# Patient Record
Sex: Female | Born: 1985 | Race: White | Hispanic: No | Marital: Single | Smoking: Former smoker
Health system: Southern US, Community
[De-identification: ages and names within clinical notes are randomized; demographics above are authoritative.]

## PROBLEM LIST (undated history)

## (undated) DIAGNOSIS — Z87440 Personal history of urinary (tract) infections: Secondary | ICD-10-CM

## (undated) DIAGNOSIS — F4310 Post-traumatic stress disorder, unspecified: Secondary | ICD-10-CM

## (undated) DIAGNOSIS — G43909 Migraine, unspecified, not intractable, without status migrainosus: Secondary | ICD-10-CM

## (undated) DIAGNOSIS — K3184 Gastroparesis: Secondary | ICD-10-CM

## (undated) DIAGNOSIS — N83209 Unspecified ovarian cyst, unspecified side: Secondary | ICD-10-CM

## (undated) DIAGNOSIS — K922 Gastrointestinal hemorrhage, unspecified: Secondary | ICD-10-CM

## (undated) DIAGNOSIS — N200 Calculus of kidney: Secondary | ICD-10-CM

## (undated) DIAGNOSIS — G8929 Other chronic pain: Secondary | ICD-10-CM

## (undated) DIAGNOSIS — F419 Anxiety disorder, unspecified: Secondary | ICD-10-CM

## (undated) DIAGNOSIS — M549 Dorsalgia, unspecified: Secondary | ICD-10-CM

## (undated) DIAGNOSIS — C9590 Leukemia, unspecified not having achieved remission: Secondary | ICD-10-CM

## (undated) HISTORY — PX: TUBAL LIGATION: SHX77

## (undated) HISTORY — PX: FRACTURE SURGERY: SHX138

## (undated) HISTORY — PX: JOINT REPLACEMENT: SHX530

## (undated) HISTORY — PX: APPENDECTOMY: SHX54

---

## 2012-12-19 DIAGNOSIS — N390 Urinary tract infection, site not specified: Secondary | ICD-10-CM | POA: Insufficient documentation

## 2012-12-19 DIAGNOSIS — Z3202 Encounter for pregnancy test, result negative: Secondary | ICD-10-CM | POA: Insufficient documentation

## 2012-12-19 DIAGNOSIS — F411 Generalized anxiety disorder: Secondary | ICD-10-CM | POA: Insufficient documentation

## 2012-12-19 DIAGNOSIS — R1110 Vomiting, unspecified: Secondary | ICD-10-CM | POA: Insufficient documentation

## 2012-12-19 DIAGNOSIS — Z87442 Personal history of urinary calculi: Secondary | ICD-10-CM | POA: Insufficient documentation

## 2012-12-19 DIAGNOSIS — M549 Dorsalgia, unspecified: Secondary | ICD-10-CM | POA: Insufficient documentation

## 2012-12-19 DIAGNOSIS — Z8659 Personal history of other mental and behavioral disorders: Secondary | ICD-10-CM | POA: Insufficient documentation

## 2012-12-19 DIAGNOSIS — G8929 Other chronic pain: Secondary | ICD-10-CM | POA: Insufficient documentation

## 2012-12-19 DIAGNOSIS — Z79899 Other long term (current) drug therapy: Secondary | ICD-10-CM | POA: Insufficient documentation

## 2012-12-19 DIAGNOSIS — G43909 Migraine, unspecified, not intractable, without status migrainosus: Secondary | ICD-10-CM | POA: Insufficient documentation

## 2012-12-19 DIAGNOSIS — R197 Diarrhea, unspecified: Secondary | ICD-10-CM | POA: Insufficient documentation

## 2012-12-19 DIAGNOSIS — Z8742 Personal history of other diseases of the female genital tract: Secondary | ICD-10-CM | POA: Insufficient documentation

## 2012-12-20 ENCOUNTER — Encounter (HOSPITAL_BASED_OUTPATIENT_CLINIC_OR_DEPARTMENT_OTHER): Payer: Self-pay | Admitting: Emergency Medicine

## 2012-12-20 ENCOUNTER — Emergency Department (HOSPITAL_BASED_OUTPATIENT_CLINIC_OR_DEPARTMENT_OTHER)
Admission: EM | Admit: 2012-12-20 | Discharge: 2012-12-20 | Disposition: A | Discharge status: Home / Self Care | Payer: Medicaid Other | Attending: Emergency Medicine | Admitting: Emergency Medicine

## 2012-12-20 ENCOUNTER — Emergency Department (HOSPITAL_BASED_OUTPATIENT_CLINIC_OR_DEPARTMENT_OTHER): Payer: Medicaid Other

## 2012-12-20 DIAGNOSIS — N390 Urinary tract infection, site not specified: Secondary | ICD-10-CM

## 2012-12-20 HISTORY — DX: Dorsalgia, unspecified: M54.9

## 2012-12-20 HISTORY — DX: Other chronic pain: G89.29

## 2012-12-20 HISTORY — DX: Calculus of kidney: N20.0

## 2012-12-20 HISTORY — DX: Post-traumatic stress disorder, unspecified: F43.10

## 2012-12-20 HISTORY — DX: Leukemia, unspecified not having achieved remission: C95.90

## 2012-12-20 HISTORY — DX: Anxiety disorder, unspecified: F41.9

## 2012-12-20 HISTORY — DX: Migraine, unspecified, not intractable, without status migrainosus: G43.909

## 2012-12-20 HISTORY — DX: Unspecified ovarian cyst, unspecified side: N83.209

## 2012-12-20 LAB — CBC WITH DIFFERENTIAL/PLATELET
Basophils Absolute: 0 10*3/uL (ref 0.0–0.1)
Eosinophils Absolute: 0.4 10*3/uL (ref 0.0–0.7)
Eosinophils Relative: 3 % (ref 0–5)
Hemoglobin: 13.7 g/dL (ref 12.0–15.0)
Lymphs Abs: 4 10*3/uL (ref 0.7–4.0)
MCH: 29.4 pg (ref 26.0–34.0)
MCHC: 35 g/dL (ref 30.0–36.0)
MCV: 83.9 fL (ref 78.0–100.0)
Monocytes Relative: 5 % (ref 3–12)
Platelets: 306 10*3/uL (ref 150–400)
RBC: 4.66 MIL/uL (ref 3.87–5.11)

## 2012-12-20 LAB — URINALYSIS, ROUTINE W REFLEX MICROSCOPIC
Bilirubin Urine: NEGATIVE
Hgb urine dipstick: NEGATIVE
Ketones, ur: NEGATIVE mg/dL
Protein, ur: NEGATIVE mg/dL
Urobilinogen, UA: 1 mg/dL (ref 0.0–1.0)

## 2012-12-20 LAB — GC/CHLAMYDIA PROBE AMP: GC Probe RNA: NEGATIVE

## 2012-12-20 LAB — BASIC METABOLIC PANEL
BUN: 10 mg/dL (ref 6–23)
Calcium: 9.8 mg/dL (ref 8.4–10.5)
GFR calc non Af Amer: >90 mL/min (ref >90)
Glucose, Bld: 98 mg/dL (ref 70–99)
Sodium: 143 mEq/L (ref 135–145)

## 2012-12-20 LAB — URINE MICROSCOPIC-ADD ON

## 2012-12-20 LAB — WET PREP, GENITAL: Trich, Wet Prep: NONE SEEN

## 2012-12-20 MED ORDER — HYDROMORPHONE HCL PF 1 MG/ML IJ SOLN
1.0000 mg | Freq: Once | INTRAMUSCULAR | Status: AC
  Administered 2012-12-20: 1 mg via INTRAVENOUS
  Filled 2012-12-20: qty 1

## 2012-12-20 MED ORDER — ONDANSETRON HCL 4 MG/2ML IJ SOLN
4.0000 mg | Freq: Once | INTRAMUSCULAR | Status: AC
  Administered 2012-12-20: 4 mg via INTRAVENOUS
  Filled 2012-12-20: qty 2

## 2012-12-20 MED ORDER — ONDANSETRON 8 MG PO TBDP: ORAL_TABLET | ORAL | Status: DC

## 2012-12-20 MED ORDER — PHENAZOPYRIDINE HCL 200 MG PO TABS: 200.0000 mg | ORAL_TABLET | Freq: Three times a day (TID) | ORAL | Status: DC

## 2012-12-20 MED ORDER — NITROFURANTOIN MONOHYD MACRO 100 MG PO CAPS: 100.0000 mg | ORAL_CAPSULE | Freq: Two times a day (BID) | ORAL | Status: DC

## 2012-12-20 NOTE — ED Notes (Signed)
MD at bedside. 

## 2012-12-20 NOTE — ED Notes (Signed)
Pt c/o lower abd pain with n/v/d x 3 days 

## 2012-12-20 NOTE — ED Provider Notes (Signed)
CSN: [id number]     Arrival date & time 12/19/12  2356 History   First MD Initiated Contact with Patient 12/20/12 0024     Chief Complaint  Patient presents with  . Abdominal Pain   (Consider location/radiation/quality/duration/timing/severity/associated sxs/prior Treatment) Patient is a 27 y.o. female presenting with abdominal pain. The history is provided by the patient.  Abdominal Pain Pain location:  R flank and suprapubic Pain quality: aching   Pain severity:  Severe Onset quality:  Gradual Timing:  Constant Progression:  Unchanged Context: eating   Context: not previous surgeries and not trauma   Relieved by:  Nothing Worsened by:  Nothing tried Ineffective treatments: oxycodone. Associated symptoms: diarrhea and vomiting   Associated symptoms: no dysuria, no fever and no vaginal bleeding   Diarrhea:    Quality:  Watery   Severity:  Moderate   Timing:  Intermittent   Progression:  Unchanged Vomiting:    Quality:  Stomach contents   Severity:  Moderate   Timing:  Intermittent   Progression:  Unchanged Risk factors: not pregnant     Past Medical History  Diagnosis Date  . Leukemia   . Migraine   . Kidney stones   . Ovarian cyst   . PTSD (post-traumatic stress disorder)   . Anxiety   . Chronic back pain    Past Surgical History  Procedure Laterality Date  . Appendectomy    . Tubal ligation    . Joint replacement     History reviewed. No pertinent family history. History  Substance Use Topics  . Smoking status: Never Smoker   . Smokeless tobacco: Not on file  . Alcohol Use: No   OB History   Grav Para Term Preterm Abortions TAB SAB Ect Mult Living                 Review of Systems  Constitutional: Negative for fever.  Gastrointestinal: Positive for vomiting, abdominal pain and diarrhea.  Genitourinary: Negative for dysuria and vaginal bleeding.  All other systems reviewed and are negative.    Allergies  Compazine; Morphine and related;  Norco; Penicillins; Rocephin; Sulfa antibiotics; and Toradol  Home Medications   Current Outpatient Rx  Name  Route  Sig  Dispense  Refill  . ALPRAZolam (XANAX) 1 MG tablet   Oral   Take 1 mg by mouth at bedtime as needed for anxiety.         Marland Kitchen desipramine (NORPRAMIN) 50 MG tablet   Oral   Take 50 mg by mouth daily.         Marland Kitchen dicyclomine (BENTYL) 10 MG capsule   Oral   Take 10 mg by mouth 4 (four) times daily -  before meals and at bedtime.         . gabapentin (NEURONTIN) 100 MG capsule   Oral   Take 100 mg by mouth 3 (three) times daily.         . metoCLOPramide (REGLAN) 10 MG tablet   Oral   Take 10 mg by mouth 4 (four) times daily.         . ondansetron (ZOFRAN) 4 MG tablet   Oral   Take 4 mg by mouth every 8 (eight) hours as needed for nausea or vomiting.         Marland Kitchen oxyCODONE-acetaminophen (ROXICET) 5-325 MG/5ML solution   Oral   Take by mouth every 4 (four) hours as needed for severe pain.         . phenylephrine-chlorpheniramine-dihydrocodeine (  PANCOF-PD) 7.5-2-3 MG/5ML syrup   Oral   Take 5 mLs by mouth every 6 (six) hours as needed for cough.         . promethazine (PHENERGAN) 25 MG tablet   Oral   Take 25 mg by mouth every 6 (six) hours as needed for nausea or vomiting.          BP 125/86  Pulse 82  Temp(Src) 98.2 F (36.8 C) (Oral)  Resp 16  Ht 5\' 6"  (1.676 m)  Wt 146 lb (66.225 kg)  BMI 23.58 kg/m2  SpO2 99%  LMP 12/13/2012 Physical Exam  Constitutional: She is oriented to person, place, and time. She appears well-developed and well-nourished. No distress.  HENT:  Head: Normocephalic and atraumatic.  Mouth/Throat: Oropharynx is clear and moist. No oropharyngeal exudate.  Eyes: Conjunctivae are normal. Pupils are equal, round, and reactive to light.  Neck: Normal range of motion. Neck supple.  Cardiovascular: Normal rate, regular rhythm and intact distal pulses.   Pulmonary/Chest: Effort normal and breath sounds normal. She has  no wheezes. She has no rales.  Abdominal: Soft. She exhibits no distension and no mass. Bowel sounds are increased. There is no tenderness. There is no rebound and no guarding.  Musculoskeletal: Normal range of motion.  Neurological: She is alert and oriented to person, place, and time.  Skin: Skin is warm and dry.  Psychiatric: She has a normal mood and affect.    ED Course  Procedures (including critical care time) Labs Review Labs Reviewed  WET PREP, GENITAL - Abnormal; Notable for the following:    WBC, Wet Prep HPF POC FEW (*)    All other components within normal limits  URINALYSIS, ROUTINE W REFLEX MICROSCOPIC - Abnormal; Notable for the following:    Leukocytes, UA SMALL (*)    All other components within normal limits  URINE MICROSCOPIC-ADD ON - Abnormal; Notable for the following:    Squamous Epithelial / LPF FEW (*)    Bacteria, UA FEW (*)    Crystals CA OXALATE CRYSTALS (*)    All other components within normal limits  CBC WITH DIFFERENTIAL - Abnormal; Notable for the following:    WBC 11.5 (*)    All other components within normal limits  BASIC METABOLIC PANEL - Abnormal; Notable for the following:    Potassium 3.4 (*)    All other components within normal limits  URINE CULTURE  GC/CHLAMYDIA PROBE AMP  PREGNANCY, URINE   Imaging Review Ct Abdomen Pelvis Wo Contrast  12/20/2012   CLINICAL DATA:  Right flank pain, pelvic pain, nausea and vomiting.  EXAM: CT ABDOMEN AND PELVIS WITHOUT CONTRAST  TECHNIQUE: Multidetector CT imaging of the abdomen and pelvis was performed following the standard protocol without intravenous contrast.  COMPARISON:  None.  FINDINGS: The visualized lung bases are clear.  The liver and spleen are unremarkable in appearance. The gallbladder is within normal limits. The pancreas and adrenal glands are unremarkable.  Scattered tiny nonobstructing stones are seen within the right renal calyces. The kidneys are otherwise unremarkable in appearance.  There is no evidence of hydronephrosis. No obstructing ureteral stones are seen.  No free fluid is identified. The small bowel is unremarkable in appearance. The stomach is within normal limits. No acute vascular abnormalities are seen. Mild soft tissue stranding is noted within the small bowel mesentery, nonspecific in appearance.  The patient is status post appendectomy. The colon is largely filled with air and is unremarkable in appearance.  The bladder is  mildly distended and grossly unremarkable in appearance. The uterus is within normal limits. The ovaries are relatively symmetric; no suspicious adnexal masses are seen. No inguinal lymphadenopathy is seen.  No acute osseous abnormalities are identified.  IMPRESSION: 1. No evidence of hydronephrosis. No obstructing ureteral stones seen. 2. Scattered tiny nonobstructing stones noted within the right renal calyces. 3. Mild nonspecific soft tissue stranding within the small bowel mesentery; this may reflect the patient's baseline.   Electronically Signed   By: Roanna Raider M.D.   On: 12/20/2012 02:30    EKG Interpretation   None       MDM   1. UTI (lower urinary tract infection)    Suspect UTI and superimposed gas as source of patient's symptoms.  Patient recently filled 120 oxycodone on 10/31.  Will add pyridium and macrobid and zofran ODT for pain relief and to cover UTI    Chaniece Barbato Smitty Cords, MD 12/20/12 [phone]

## 2012-12-21 ENCOUNTER — Encounter (HOSPITAL_BASED_OUTPATIENT_CLINIC_OR_DEPARTMENT_OTHER): Payer: Self-pay | Admitting: Emergency Medicine

## 2012-12-21 ENCOUNTER — Emergency Department (HOSPITAL_BASED_OUTPATIENT_CLINIC_OR_DEPARTMENT_OTHER)
Admission: EM | Admit: 2012-12-21 | Discharge: 2012-12-21 | Disposition: A | Discharge status: Home / Self Care | Payer: Medicaid Other | Attending: Emergency Medicine | Admitting: Emergency Medicine

## 2012-12-21 DIAGNOSIS — Z8742 Personal history of other diseases of the female genital tract: Secondary | ICD-10-CM | POA: Insufficient documentation

## 2012-12-21 DIAGNOSIS — F411 Generalized anxiety disorder: Secondary | ICD-10-CM | POA: Insufficient documentation

## 2012-12-21 DIAGNOSIS — G43909 Migraine, unspecified, not intractable, without status migrainosus: Secondary | ICD-10-CM | POA: Insufficient documentation

## 2012-12-21 DIAGNOSIS — G8929 Other chronic pain: Secondary | ICD-10-CM | POA: Insufficient documentation

## 2012-12-21 DIAGNOSIS — R1013 Epigastric pain: Secondary | ICD-10-CM | POA: Insufficient documentation

## 2012-12-21 DIAGNOSIS — Z880 Allergy status to penicillin: Secondary | ICD-10-CM | POA: Insufficient documentation

## 2012-12-21 DIAGNOSIS — Z79899 Other long term (current) drug therapy: Secondary | ICD-10-CM | POA: Insufficient documentation

## 2012-12-21 DIAGNOSIS — F4310 Post-traumatic stress disorder, unspecified: Secondary | ICD-10-CM | POA: Insufficient documentation

## 2012-12-21 DIAGNOSIS — R109 Unspecified abdominal pain: Secondary | ICD-10-CM | POA: Insufficient documentation

## 2012-12-21 DIAGNOSIS — Z87442 Personal history of urinary calculi: Secondary | ICD-10-CM | POA: Insufficient documentation

## 2012-12-21 DIAGNOSIS — Z856 Personal history of leukemia: Secondary | ICD-10-CM | POA: Insufficient documentation

## 2012-12-21 LAB — URINALYSIS, ROUTINE W REFLEX MICROSCOPIC
Leukocytes, UA: NEGATIVE
Nitrite: NEGATIVE
Specific Gravity, Urine: 1.02 (ref 1.005–1.030)
pH: 6.5 (ref 5.0–8.0)

## 2012-12-21 LAB — CBC WITH DIFFERENTIAL/PLATELET
Basophils Absolute: 0.1 10*3/uL (ref 0.0–0.1)
Eosinophils Absolute: 0.4 10*3/uL (ref 0.0–0.7)
Eosinophils Relative: 4 % (ref 0–5)
HCT: 35.3 % — ABNORMAL LOW (ref 36.0–46.0)
Hemoglobin: 12.2 g/dL (ref 12.0–15.0)
Lymphocytes Relative: 34 % (ref 12–46)
Lymphs Abs: 3 10*3/uL (ref 0.7–4.0)
MCH: 29.3 pg (ref 26.0–34.0)
MCV: 84.7 fL (ref 78.0–100.0)
Monocytes Absolute: 0.5 10*3/uL (ref 0.1–1.0)
Platelets: 251 10*3/uL (ref 150–400)
RBC: 4.17 MIL/uL (ref 3.87–5.11)
WBC: 8.7 10*3/uL (ref 4.0–10.5)

## 2012-12-21 LAB — CG4 I-STAT (LACTIC ACID): Lactic Acid, Venous: 2.28 mmol/L — ABNORMAL HIGH (ref 0.5–2.2)

## 2012-12-21 LAB — HEPATIC FUNCTION PANEL
AST: 10 U/L (ref 0–37)
Albumin: 3.8 g/dL (ref 3.5–5.2)
Total Protein: 6.9 g/dL (ref 6.0–8.3)

## 2012-12-21 LAB — URINE CULTURE: Culture: >=100000

## 2012-12-21 MED ORDER — ONDANSETRON 4 MG PO TBDP
4.0000 mg | ORAL_TABLET | Freq: Once | ORAL | Status: AC
  Administered 2012-12-21: 4 mg via ORAL
  Filled 2012-12-21: qty 1

## 2012-12-21 MED ORDER — HYDROMORPHONE HCL PF 2 MG/ML IJ SOLN
2.0000 mg | Freq: Once | INTRAMUSCULAR | Status: AC
  Administered 2012-12-21: 2 mg via INTRAMUSCULAR
  Filled 2012-12-21: qty 1

## 2012-12-21 NOTE — ED Provider Notes (Signed)
CSN: [id number]     Arrival date & time 12/21/12  0014 History   First MD Initiated Contact with Patient 12/21/12 0026     Chief Complaint  Patient presents with  . Abdominal Pain   (Consider location/radiation/quality/duration/timing/severity/associated sxs/prior Treatment) HPI This is a 27 year old female who was seen here yesterday morning for abdominal pain which was predominantly in the suprapubic region and right flank. She had a workup that included a CT scan which was unremarkable. Her urinalysis was borderline for urinary tract infection. She was treated with Macrobid and phenazopyridine. She returns stating her pain is worse. She describes it as severe and now he is to use, more prominent in the epigastrium and still present in the right flank. There is a crampy elements of the suprapubic pain. The pain has been associated with nausea, vomiting and diarrhea. She states she had fever of 102 yesterday.  Past Medical History  Diagnosis Date  . Leukemia   . Migraine   . Kidney stones   . Ovarian cyst   . PTSD (post-traumatic stress disorder)   . Anxiety   . Chronic back pain    Past Surgical History  Procedure Laterality Date  . Appendectomy    . Tubal ligation    . Joint replacement    . Fracture surgery     No family history on file. History  Substance Use Topics  . Smoking status: Never Smoker   . Smokeless tobacco: Never Used  . Alcohol Use: No   OB History   Grav Para Term Preterm Abortions TAB SAB Ect Mult Living                 Review of Systems  All other systems reviewed and are negative.    Allergies  Compazine; Morphine and related; Norco; Penicillins; Rocephin; Sulfa antibiotics; and Toradol  Home Medications   Current Outpatient Rx  Name  Route  Sig  Dispense  Refill  . ALPRAZolam (XANAX) 1 MG tablet   Oral   Take 1 mg by mouth at bedtime as needed for anxiety.         Marland Kitchen desipramine (NORPRAMIN) 50 MG tablet   Oral   Take 50 mg by mouth  daily.         Marland Kitchen dicyclomine (BENTYL) 10 MG capsule   Oral   Take 10 mg by mouth 4 (four) times daily -  before meals and at bedtime.         . gabapentin (NEURONTIN) 100 MG capsule   Oral   Take 100 mg by mouth 3 (three) times daily.         . metoCLOPramide (REGLAN) 10 MG tablet   Oral   Take 10 mg by mouth 4 (four) times daily.         . nitrofurantoin, macrocrystal-monohydrate, (MACROBID) 100 MG capsule   Oral   Take 1 capsule (100 mg total) by mouth 2 (two) times daily.   14 capsule   0   . ondansetron (ZOFRAN ODT) 8 MG disintegrating tablet      8mg  ODT q8 hours prn nausea   8 tablet   0   . ondansetron (ZOFRAN) 4 MG tablet   Oral   Take 4 mg by mouth every 8 (eight) hours as needed for nausea or vomiting.         Marland Kitchen oxyCODONE-acetaminophen (ROXICET) 5-325 MG/5ML solution   Oral   Take by mouth every 4 (four) hours as needed for severe pain.         Marland Kitchen  phenazopyridine (PYRIDIUM) 200 MG tablet   Oral   Take 1 tablet (200 mg total) by mouth 3 (three) times daily.   6 tablet   0   . phenylephrine-chlorpheniramine-dihydrocodeine (PANCOF-PD) 7.5-2-3 MG/5ML syrup   Oral   Take 5 mLs by mouth every 6 (six) hours as needed for cough.         . promethazine (PHENERGAN) 25 MG tablet   Oral   Take 25 mg by mouth every 6 (six) hours as needed for nausea or vomiting.          BP 132/91  Temp(Src) 97.9 F (36.6 C) (Oral)  Resp 16  Ht 5\' 6"  (1.676 m)  Wt 145 lb (65.772 kg)  BMI 23.41 kg/m2  SpO2 97%  LMP 12/13/2012  Physical Exam General: Well-developed, well-nourished female in no acute distress; appearance consistent with age of record HENT: normocephalic; atraumatic Eyes: pupils equal, round and reactive to light; extraocular muscles intact Neck: supple Heart: regular rate and rhythm Lungs: clear to auscultation bilaterally Abdomen: soft; nondistended; diffusely tender; no masses or hepatosplenomegaly; bowel sounds present GU: Right CVA  tenderness Extremities: No deformity; full range of motion Neurologic: Awake, alert and oriented; motor function intact in all extremities and symmetric; no facial droop Skin: Warm and dry Psychiatric: Flat affect    ED Course  Procedures (including critical care time)  MDM   Nursing notes and vitals signs, including pulse oximetry, reviewed.  Summary of this visit's results, reviewed by myself:  Labs:  Results for orders placed during the hospital encounter of 12/21/12 (from the past 24 hour(s))  URINALYSIS, ROUTINE W REFLEX MICROSCOPIC     Status: None   Collection Time    12/21/12 12:30 AM      Result Value Range   Color, Urine YELLOW  YELLOW   APPearance CLEAR  CLEAR   Specific Gravity, Urine 1.020  1.005 - 1.030   pH 6.5  5.0 - 8.0   Glucose, UA NEGATIVE  NEGATIVE mg/dL   Hgb urine dipstick NEGATIVE  NEGATIVE   Bilirubin Urine NEGATIVE  NEGATIVE   Ketones, ur NEGATIVE  NEGATIVE mg/dL   Protein, ur NEGATIVE  NEGATIVE mg/dL   Urobilinogen, UA 0.2  0.0 - 1.0 mg/dL   Nitrite NEGATIVE  NEGATIVE   Leukocytes, UA NEGATIVE  NEGATIVE  HEPATIC FUNCTION PANEL     Status: Abnormal   Collection Time    12/21/12 12:58 AM      Result Value Range   Total Protein 6.9  6.0 - 8.3 g/dL   Albumin 3.8  3.5 - 5.2 g/dL   AST 10  0 - 37 U/L   ALT 13  0 - 35 U/L   Alkaline Phosphatase 60  39 - 117 U/L   Total Bilirubin 0.2 (*) 0.3 - 1.2 mg/dL   Bilirubin, Direct <5.6  0.0 - 0.3 mg/dL   Indirect Bilirubin NOT CALCULATED  0.3 - 0.9 mg/dL  LIPASE, BLOOD     Status: None   Collection Time    12/21/12 12:58 AM      Result Value Range   Lipase 37  11 - 59 U/L  CBC WITH DIFFERENTIAL     Status: Abnormal   Collection Time    12/21/12 12:58 AM      Result Value Range   WBC 8.7  4.0 - 10.5 K/uL   RBC 4.17  3.87 - 5.11 MIL/uL   Hemoglobin 12.2  12.0 - 15.0 g/dL   HCT 21.3 (*) 08.6 -  46.0 %   MCV 84.7  78.0 - 100.0 fL   MCH 29.3  26.0 - 34.0 pg   MCHC 34.6  30.0 - 36.0 g/dL   RDW 21.3   08.6 - 57.8 %   Platelets 251  150 - 400 K/uL   Neutrophils Relative % 56  43 - 77 %   Neutro Abs 4.8  1.7 - 7.7 K/uL   Lymphocytes Relative 34  12 - 46 %   Lymphs Abs 3.0  0.7 - 4.0 K/uL   Monocytes Relative 5  3 - 12 %   Monocytes Absolute 0.5  0.1 - 1.0 K/uL   Eosinophils Relative 4  0 - 5 %   Eosinophils Absolute 0.4  0.0 - 0.7 K/uL   Basophils Relative 1  0 - 1 %   Basophils Absolute 0.1  0.0 - 0.1 K/uL  CG4 I-STAT (LACTIC ACID)     Status: Abnormal   Collection Time    12/21/12  1:10 AM      Result Value Range   Lactic Acid, Venous 2.28 (*) 0.5 - 2.2 mmol/L    Imaging Studies: Ct Abdomen Pelvis Wo Contrast  12/20/2012   CLINICAL DATA:  Right flank pain, pelvic pain, nausea and vomiting.  EXAM: CT ABDOMEN AND PELVIS WITHOUT CONTRAST  TECHNIQUE: Multidetector CT imaging of the abdomen and pelvis was performed following the standard protocol without intravenous contrast.  COMPARISON:  None.  FINDINGS: The visualized lung bases are clear.  The liver and spleen are unremarkable in appearance. The gallbladder is within normal limits. The pancreas and adrenal glands are unremarkable.  Scattered tiny nonobstructing stones are seen within the right renal calyces. The kidneys are otherwise unremarkable in appearance. There is no evidence of hydronephrosis. No obstructing ureteral stones are seen.  No free fluid is identified. The small bowel is unremarkable in appearance. The stomach is within normal limits. No acute vascular abnormalities are seen. Mild soft tissue stranding is noted within the small bowel mesentery, nonspecific in appearance.  The patient is status post appendectomy. The colon is largely filled with air and is unremarkable in appearance.  The bladder is mildly distended and grossly unremarkable in appearance. The uterus is within normal limits. The ovaries are relatively symmetric; no suspicious adnexal masses are seen. No inguinal lymphadenopathy is seen.  No acute osseous  abnormalities are identified.  IMPRESSION: 1. No evidence of hydronephrosis. No obstructing ureteral stones seen. 2. Scattered tiny nonobstructing stones noted within the right renal calyces. 3. Mild nonspecific soft tissue stranding within the small bowel mesentery; this may reflect the patient's baseline.   Electronically Signed   By: Roanna Raider M.D.   On: 12/20/2012 02:30   2:22 AM Patient has been stable in the ED. She states her pain is like that associated with a previous failed pregnancy that was not seen on CT but was found on ultrasound. Because we do not have ultrasound capability at this facility she will followup at [facility] later today. She was advised of her lab and CT findings.     Hanley Seamen, MD 12/21/12 [id number]

## 2012-12-21 NOTE — ED Notes (Signed)
Was seen here last pm dx w uti,  Pain has moved from pelvic area to lower abd

## 2012-12-23 ENCOUNTER — Ambulatory Visit (HOSPITAL_BASED_OUTPATIENT_CLINIC_OR_DEPARTMENT_OTHER): Admit: 2012-12-23 | Payer: Medicaid Other

## 2013-01-01 ENCOUNTER — Emergency Department (HOSPITAL_BASED_OUTPATIENT_CLINIC_OR_DEPARTMENT_OTHER)
Admission: EM | Admit: 2013-01-01 | Discharge: 2013-01-01 | Disposition: A | Discharge status: Home / Self Care | Payer: Medicaid Other | Attending: Emergency Medicine | Admitting: Emergency Medicine

## 2013-01-01 ENCOUNTER — Encounter (HOSPITAL_BASED_OUTPATIENT_CLINIC_OR_DEPARTMENT_OTHER): Payer: Self-pay | Admitting: Emergency Medicine

## 2013-01-01 DIAGNOSIS — Z8639 Personal history of other endocrine, nutritional and metabolic disease: Secondary | ICD-10-CM | POA: Insufficient documentation

## 2013-01-01 DIAGNOSIS — Z9851 Tubal ligation status: Secondary | ICD-10-CM | POA: Insufficient documentation

## 2013-01-01 DIAGNOSIS — Z79899 Other long term (current) drug therapy: Secondary | ICD-10-CM | POA: Insufficient documentation

## 2013-01-01 DIAGNOSIS — Z87891 Personal history of nicotine dependence: Secondary | ICD-10-CM | POA: Insufficient documentation

## 2013-01-01 DIAGNOSIS — Z3202 Encounter for pregnancy test, result negative: Secondary | ICD-10-CM | POA: Insufficient documentation

## 2013-01-01 DIAGNOSIS — N949 Unspecified condition associated with female genital organs and menstrual cycle: Secondary | ICD-10-CM | POA: Insufficient documentation

## 2013-01-01 DIAGNOSIS — R112 Nausea with vomiting, unspecified: Secondary | ICD-10-CM | POA: Insufficient documentation

## 2013-01-01 DIAGNOSIS — G43909 Migraine, unspecified, not intractable, without status migrainosus: Secondary | ICD-10-CM | POA: Insufficient documentation

## 2013-01-01 DIAGNOSIS — R102 Pelvic and perineal pain: Secondary | ICD-10-CM

## 2013-01-01 DIAGNOSIS — Z880 Allergy status to penicillin: Secondary | ICD-10-CM | POA: Insufficient documentation

## 2013-01-01 DIAGNOSIS — Z9089 Acquired absence of other organs: Secondary | ICD-10-CM | POA: Insufficient documentation

## 2013-01-01 DIAGNOSIS — Z856 Personal history of leukemia: Secondary | ICD-10-CM | POA: Insufficient documentation

## 2013-01-01 DIAGNOSIS — F411 Generalized anxiety disorder: Secondary | ICD-10-CM | POA: Insufficient documentation

## 2013-01-01 DIAGNOSIS — Z87442 Personal history of urinary calculi: Secondary | ICD-10-CM | POA: Insufficient documentation

## 2013-01-01 DIAGNOSIS — N898 Other specified noninflammatory disorders of vagina: Secondary | ICD-10-CM | POA: Insufficient documentation

## 2013-01-01 DIAGNOSIS — G8929 Other chronic pain: Secondary | ICD-10-CM | POA: Insufficient documentation

## 2013-01-01 DIAGNOSIS — R509 Fever, unspecified: Secondary | ICD-10-CM | POA: Insufficient documentation

## 2013-01-01 DIAGNOSIS — Z8719 Personal history of other diseases of the digestive system: Secondary | ICD-10-CM | POA: Insufficient documentation

## 2013-01-01 DIAGNOSIS — Z862 Personal history of diseases of the blood and blood-forming organs and certain disorders involving the immune mechanism: Secondary | ICD-10-CM | POA: Insufficient documentation

## 2013-01-01 HISTORY — DX: Gastrointestinal hemorrhage, unspecified: K92.2

## 2013-01-01 HISTORY — DX: Gastroparesis: K31.84

## 2013-01-01 LAB — CBC WITH DIFFERENTIAL/PLATELET
Basophils Absolute: 0 10*3/uL (ref 0.0–0.1)
Basophils Relative: 0 % (ref 0–1)
Eosinophils Relative: 3 % (ref 0–5)
Lymphocytes Relative: 26 % (ref 12–46)
Lymphs Abs: 2.6 10*3/uL (ref 0.7–4.0)
Neutro Abs: 6.7 10*3/uL (ref 1.7–7.7)
Neutrophils Relative %: 67 % (ref 43–77)
Platelets: 281 10*3/uL (ref 150–400)
RBC: 4.78 MIL/uL (ref 3.87–5.11)
RDW: 12.5 % (ref 11.5–15.5)
WBC: 9.9 10*3/uL (ref 4.0–10.5)

## 2013-01-01 LAB — WET PREP, GENITAL
Trich, Wet Prep: NONE SEEN
Yeast Wet Prep HPF POC: NONE SEEN

## 2013-01-01 LAB — URINALYSIS, ROUTINE W REFLEX MICROSCOPIC
Bilirubin Urine: NEGATIVE
Nitrite: NEGATIVE
Specific Gravity, Urine: 1.021 (ref 1.005–1.030)
Urobilinogen, UA: 0.2 mg/dL (ref 0.0–1.0)
pH: 6.5 (ref 5.0–8.0)

## 2013-01-01 LAB — BASIC METABOLIC PANEL
BUN: 16 mg/dL (ref 6–23)
CO2: 19 mEq/L (ref 19–32)
Calcium: 9.2 mg/dL (ref 8.4–10.5)
Glucose, Bld: 206 mg/dL — ABNORMAL HIGH (ref 70–99)
Potassium: 3.7 mEq/L (ref 3.5–5.1)
Sodium: 139 mEq/L (ref 135–145)

## 2013-01-01 LAB — URINE MICROSCOPIC-ADD ON

## 2013-01-01 LAB — PREGNANCY, URINE: Preg Test, Ur: NEGATIVE

## 2013-01-01 MED ORDER — ONDANSETRON 4 MG PO TBDP
4.0000 mg | ORAL_TABLET | Freq: Once | ORAL | Status: AC
  Administered 2013-01-01: 4 mg via ORAL
  Filled 2013-01-01: qty 1

## 2013-01-01 MED ORDER — ONDANSETRON HCL 4 MG PO TABS: 4.0000 mg | ORAL_TABLET | Freq: Three times a day (TID) | ORAL | Status: DC | PRN

## 2013-01-01 NOTE — ED Notes (Signed)
Pt states that yesterday she started having N/V, fever, right lower abd pain, and bright red vaginal bleeding.

## 2013-01-01 NOTE — ED Provider Notes (Signed)
CSN: [id number]     Arrival date & time 01/01/13  2045 History  This chart was scribed for Amy Estes Mayers, MD by Ardelia Mems, ED Scribe. This patient was seen in room MH08/MH08 and the patient's care was started at 9:04 PM.   Chief Complaint  Patient presents with  . Abdominal Pain    The history is provided by the patient. No language interpreter was used.    HPI Comments: Amy Estes is a 27 y.o. female with a history of gastroparesis, GI bleed, nephrolithiasis, renal cyst and ovarian cyst who presents to the Emergency Department complaining of intermittent, moderate RLQ abdominal pain onset yesterday. She reports associated nausea and multiple episodes of emesis since yesterday. She states that she has had no blood in her emesis or stool. She also reports an associated fever today, with a highest temperature at home of 103 F about 5 hours ago. ED temperature is 98.5 F. She also states that she has had "sporadic, gushing" bright red vaginal bleeding intermittently over the past couple of days, which she states she doesn't not believe is menstrual. She states that she becomes dizzy at times with this vaginal bleeding. She states that her LNMP was 12/13/12. She reports being seen on 12/26/12 at [facility] with LUQ abdominal pain, and states that she was diagnosed with renal and ovarian cysts. She states that she has been taking her chronic pain medications without relief. She states that she has a surgical history of appendectomy and tubal ligation. She states that she has been pregnant once since having the tubal ligation. She denies sore throat, rhinorrhea, cough or any other symptoms.  PCP- Dr. Burman Freestone   Past Medical History  Diagnosis Date  . Leukemia   . Migraine   . Kidney stones   . Ovarian cyst   . PTSD (post-traumatic stress disorder)   . Anxiety   . Chronic back pain   . Gastroparesis   . GI bleed    Past Surgical History  Procedure Laterality Date  . Appendectomy    .  Tubal ligation    . Joint replacement    . Fracture surgery     No family history on file. History  Substance Use Topics  . Smoking status: Former Games developer  . Smokeless tobacco: Never Used  . Alcohol Use: No   OB History   Grav Para Term Preterm Abortions TAB SAB Ect Mult Living                 Review of Systems A complete 10 system review of systems was obtained and all systems are negative except as noted in the HPI and PMH.   Allergies  Compazine; Morphine and related; Norco; Penicillins; Rocephin; Sulfa antibiotics; and Toradol  Home Medications   Current Outpatient Rx  Name  Route  Sig  Dispense  Refill  . ALPRAZolam (XANAX) 1 MG tablet   Oral   Take 1 mg by mouth at bedtime as needed for anxiety.         Marland Kitchen desipramine (NORPRAMIN) 50 MG tablet   Oral   Take 50 mg by mouth daily.         Marland Kitchen dicyclomine (BENTYL) 10 MG capsule   Oral   Take 10 mg by mouth 4 (four) times daily -  before meals and at bedtime.         . gabapentin (NEURONTIN) 100 MG capsule   Oral   Take 100 mg by mouth 3 (three) times  daily.         . metoCLOPramide (REGLAN) 10 MG tablet   Oral   Take 10 mg by mouth 4 (four) times daily.         . nitrofurantoin, macrocrystal-monohydrate, (MACROBID) 100 MG capsule   Oral   Take 1 capsule (100 mg total) by mouth 2 (two) times daily.   14 capsule   0   . ondansetron (ZOFRAN ODT) 8 MG disintegrating tablet      8mg  ODT q8 hours prn nausea   8 tablet   0   . ondansetron (ZOFRAN) 4 MG tablet   Oral   Take 4 mg by mouth every 8 (eight) hours as needed for nausea or vomiting.         Marland Kitchen oxyCODONE-acetaminophen (ROXICET) 5-325 MG/5ML solution   Oral   Take by mouth every 4 (four) hours as needed for severe pain.         . phenazopyridine (PYRIDIUM) 200 MG tablet   Oral   Take 1 tablet (200 mg total) by mouth 3 (three) times daily.   6 tablet   0   . phenylephrine-chlorpheniramine-dihydrocodeine (PANCOF-PD) 7.5-2-3 MG/5ML  syrup   Oral   Take 5 mLs by mouth every 6 (six) hours as needed for cough.         . promethazine (PHENERGAN) 25 MG tablet   Oral   Take 25 mg by mouth every 6 (six) hours as needed for nausea or vomiting.          Triage Vitals: BP 110/86  Pulse 118  Temp(Src) 98.5 F (36.9 C) (Oral)  Resp 20  Ht 5\' 6"  (1.676 m)  Wt 146 lb (66.225 kg)  BMI 23.58 kg/m2  SpO2 98%  LMP 12/13/2012  Physical Exam  Nursing note and vitals reviewed. Constitutional: She is oriented to person, place, and time. She appears well-developed and well-nourished.  HENT:  Head: Normocephalic and atraumatic.  Eyes: EOM are normal. Pupils are equal, round, and reactive to light.  Neck: Normal range of motion. Neck supple.  Cardiovascular: Normal rate, normal heart sounds and intact distal pulses.   Pulmonary/Chest: Effort normal and breath sounds normal.  Abdominal: Bowel sounds are normal. She exhibits no distension. There is tenderness (inconsistent RLQ tenderness to palpation). There is no rebound and no guarding.  Genitourinary:  No bleeding, no discharge, no CMT, no adnexal mass, mild R adnexal tenderness  Musculoskeletal: Normal range of motion. She exhibits no edema and no tenderness.  Neurological: She is alert and oriented to person, place, and time. She has normal strength. No cranial nerve deficit or sensory deficit.  Skin: Skin is warm and dry. No rash noted.  Psychiatric: She has a normal mood and affect.    ED Course  Procedures (including critical care time)  DIAGNOSTIC STUDIES: Oxygen Saturation is 98% on RA, normal by my interpretation.    COORDINATION OF CARE: 9:09 PM- Discussed plan to recheck labs and perform a pelvic exam. Will also order Zofran. Pt advised of plan for treatment and pt agrees.  Medications  ondansetron (ZOFRAN-ODT) disintegrating tablet 4 mg (not administered)    Labs Review Labs Reviewed  WET PREP, GENITAL - Abnormal; Notable for the following:    Clue  Cells Wet Prep HPF POC MODERATE (*)    WBC, Wet Prep HPF POC FEW (*)    All other components within normal limits  URINALYSIS, ROUTINE W REFLEX MICROSCOPIC - Abnormal; Notable for the following:    Leukocytes, UA  SMALL (*)    All other components within normal limits  BASIC METABOLIC PANEL - Abnormal; Notable for the following:    Glucose, Bld 206 (*)    All other components within normal limits  URINE MICROSCOPIC-ADD ON - Abnormal; Notable for the following:    Squamous Epithelial / LPF FEW (*)    Bacteria, UA FEW (*)    All other components within normal limits  GC/CHLAMYDIA PROBE AMP  URINE CULTURE  PREGNANCY, URINE  CBC WITH DIFFERENTIAL   Imaging Review No results found.  EKG Interpretation   None       MDM   1. Pelvic pain     PT with numerous ED visits at [facility] and [facility] over the last several months with multiple imaging and lab studies which have been non-diagnostic. She has benign abdomen, doubt acute surgical process. Doubt this is ovarian torsion or TOA given time and chronicity of symptoms. She has chronic pain medications at home prescribed by Dr. Adelene Idler in [facility] (Oxycodone 10mg  x 120 last filled 10/31). Pt given Zofran for nausea and advised to followup with PCP for recheck of pelvic pain.    I personally performed the services described in this documentation, which was scribed in my presence. The recorded information has been reviewed and is accurate.      Norah Fick B. Estes Mayers, MD 01/01/13 2211

## 2013-01-03 LAB — URINE CULTURE

## 2013-01-04 LAB — GC/CHLAMYDIA PROBE AMP: GC Probe RNA: NEGATIVE

## 2013-06-15 ENCOUNTER — Encounter (HOSPITAL_BASED_OUTPATIENT_CLINIC_OR_DEPARTMENT_OTHER): Payer: Self-pay | Admitting: Emergency Medicine

## 2013-06-15 ENCOUNTER — Emergency Department (HOSPITAL_BASED_OUTPATIENT_CLINIC_OR_DEPARTMENT_OTHER)
Admission: EM | Admit: 2013-06-15 | Discharge: 2013-06-16 | Discharge status: Against Medical Advice | Payer: Medicaid Other | Attending: Emergency Medicine | Admitting: Emergency Medicine

## 2013-06-15 DIAGNOSIS — Z765 Malingerer [conscious simulation]: Secondary | ICD-10-CM | POA: Insufficient documentation

## 2013-06-15 DIAGNOSIS — Z8719 Personal history of other diseases of the digestive system: Secondary | ICD-10-CM | POA: Insufficient documentation

## 2013-06-15 DIAGNOSIS — Z856 Personal history of leukemia: Secondary | ICD-10-CM | POA: Insufficient documentation

## 2013-06-15 DIAGNOSIS — G8929 Other chronic pain: Secondary | ICD-10-CM | POA: Insufficient documentation

## 2013-06-15 DIAGNOSIS — R55 Syncope and collapse: Secondary | ICD-10-CM | POA: Insufficient documentation

## 2013-06-15 DIAGNOSIS — G43909 Migraine, unspecified, not intractable, without status migrainosus: Secondary | ICD-10-CM | POA: Insufficient documentation

## 2013-06-15 DIAGNOSIS — Z8742 Personal history of other diseases of the female genital tract: Secondary | ICD-10-CM | POA: Insufficient documentation

## 2013-06-15 DIAGNOSIS — F411 Generalized anxiety disorder: Secondary | ICD-10-CM | POA: Insufficient documentation

## 2013-06-15 DIAGNOSIS — M549 Dorsalgia, unspecified: Secondary | ICD-10-CM | POA: Insufficient documentation

## 2013-06-15 DIAGNOSIS — F17200 Nicotine dependence, unspecified, uncomplicated: Secondary | ICD-10-CM | POA: Insufficient documentation

## 2013-06-15 DIAGNOSIS — Z880 Allergy status to penicillin: Secondary | ICD-10-CM | POA: Insufficient documentation

## 2013-06-15 DIAGNOSIS — Z8781 Personal history of (healed) traumatic fracture: Secondary | ICD-10-CM | POA: Insufficient documentation

## 2013-06-15 DIAGNOSIS — Z87442 Personal history of urinary calculi: Secondary | ICD-10-CM | POA: Insufficient documentation

## 2013-06-15 DIAGNOSIS — Z79899 Other long term (current) drug therapy: Secondary | ICD-10-CM | POA: Insufficient documentation

## 2013-06-15 MED ORDER — SODIUM CHLORIDE 0.9 % IV BOLUS (SEPSIS): 1000.0000 mL | Freq: Once | INTRAVENOUS | Status: DC
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
## 2013-06-15 NOTE — ED Provider Notes (Signed)
CSN: [id number]     Arrival date & time 06/15/13  2049 History  This chart was scribed for Amy B. Karle Starch, MD by Vernell Barrier, ED scribe. This patient was seen in room MH01/MH01 and the patient's care was started at 9:20 PM.   Chief Complaint  Patient presents with  . Loss of Consciousness   The history is provided by the patient and the spouse. No language interpreter was used.   HPI Comments: Amy Estes is a 28 y.o. female w/ leukemia presents to the Emergency Department after an LOC; occuring 1-2 hours ago. States she was standing on the front porch when she got a strange feeling and started shaking; says she sat down after that. Once seated, states she couldn't breath and went inside the house to get a nebulizer treatment. Her spouse states he set up her nebulizer and went to get something to drink. Upon coming back, he states she had fallen over out of the bed; unsure if she hit her head on a side table near by. He states she was unresponsive after several tries to shake her awake. States he got her off the floor once she finally came to, got her dressed, and took her to [employer] [employer] At [facility] she was given several x-rays, and Alprazolam which he states she threw up immediately. States they were discharged and came here because they didn't feel like [facility] did anything for her.  Past Medical History  Diagnosis Date  . Leukemia   . Migraine   . Kidney stones   . Ovarian cyst   . PTSD (post-traumatic stress disorder)   . Anxiety   . Chronic back pain   . Gastroparesis   . GI bleed    Past Surgical History  Procedure Laterality Date  . Appendectomy    . Tubal ligation    . Joint replacement    . Fracture surgery     History reviewed. No pertinent family history. History  Substance Use Topics  . Smoking status: Current Some Day Smoker  . Smokeless tobacco: Never Used  . Alcohol Use: No   OB History   Grav Para Term Preterm Abortions TAB SAB Ect Mult Living                  Review of Systems  Neurological: Positive for syncope.   A complete 10 system review of systems was obtained and all systems are negative except as noted in the HPI and PMH.   Allergies  Compazine; Morphine and related; Norco; Penicillins; Rocephin; Sulfa antibiotics; and Toradol  Home Medications   Prior to Admission medications   Medication Sig Start Date End Date Taking? Authorizing Provider  albuterol (PROVENTIL HFA;VENTOLIN HFA) 108 (90 BASE) MCG/ACT inhaler Inhale into the lungs every 6 (six) hours as needed for wheezing or shortness of breath.   Yes Historical Provider, MD  albuterol (PROVENTIL) (2.5 MG/3ML) 0.083% nebulizer solution Take 2.5 mg by nebulization every 6 (six) hours as needed for wheezing or shortness of breath.   Yes Historical Provider, MD  ALPRAZolam Duanne Moron) 1 MG tablet Take 1 mg by mouth at bedtime as needed for anxiety.   Yes Historical Provider, MD  budesonide-formoterol (SYMBICORT) 160-4.5 MCG/ACT inhaler Inhale 2 puffs into the lungs 2 (two) times daily.   Yes Historical Provider, MD  desipramine (NORPRAMIN) 50 MG tablet Take 50 mg by mouth daily.   Yes Historical Provider, MD  dicyclomine (BENTYL) 10 MG capsule Take 10 mg by mouth 4 (four)  times daily -  before meals and at bedtime.   Yes Historical Provider, MD  gabapentin (NEURONTIN) 100 MG capsule Take 100 mg by mouth 3 (three) times daily.   Yes Historical Provider, MD  metoCLOPramide (REGLAN) 10 MG tablet Take 10 mg by mouth 4 (four) times daily.   Yes Historical Provider, MD  ondansetron (ZOFRAN ODT) 8 MG disintegrating tablet 8mg  ODT q8 hours prn nausea 12/20/12  Yes April K Palumbo-Rasch, MD  ondansetron (ZOFRAN) 4 MG tablet Take 4 mg by mouth every 8 (eight) hours as needed for nausea or vomiting.   Yes Historical Provider, MD  ondansetron (ZOFRAN) 4 MG tablet Take 1 tablet (4 mg total) by mouth every 8 (eight) hours as needed for nausea or vomiting. 01/01/13  Yes Amy B. Karle Starch, MD   oxyCODONE-acetaminophen (ROXICET) 5-325 MG/5ML solution Take by mouth every 4 (four) hours as needed for severe pain.   Yes Historical Provider, MD  pantoprazole (PROTONIX) 40 MG tablet Take 40 mg by mouth daily.   Yes Historical Provider, MD  promethazine (PHENERGAN) 25 MG tablet Take 25 mg by mouth every 6 (six) hours as needed for nausea or vomiting.   Yes Historical Provider, MD  topiramate (TOPAMAX) 25 MG tablet Take 25 mg by mouth 2 (two) times daily.   Yes Historical Provider, MD  phenylephrine-chlorpheniramine-dihydrocodeine (PANCOF-PD) 7.5-2-3 MG/5ML syrup Take 5 mLs by mouth every 6 (six) hours as needed for cough.    Historical Provider, MD   Triage Vitals: BP 137/100  Pulse 113  Temp(Src) 98.2 F (36.8 C) (Oral)  Resp 21  Ht 5\' 6"  (1.676 m)  Wt 150 lb (68.04 kg)  BMI 24.22 kg/m2  SpO2 98%  LMP 06/14/2013 Physical Exam  Nursing note and vitals reviewed. Constitutional: She is oriented to person, place, and time. She appears well-developed and well-nourished.  HENT:  Head: Normocephalic and atraumatic.  Dry mouth  Eyes: EOM are normal. Pupils are equal, round, and reactive to light.  Neck: Normal range of motion. Neck supple.  Cardiovascular: Normal rate, normal heart sounds and intact distal pulses.   Pulmonary/Chest: Effort normal and breath sounds normal.  Abdominal: Bowel sounds are normal. She exhibits no distension. There is no tenderness.  Musculoskeletal: Normal range of motion. She exhibits no edema and no tenderness.  Neurological: She is alert and oriented to person, place, and time. She has normal strength. No cranial nerve deficit or sensory deficit.  Skin: Skin is warm and dry. No rash noted.  Psychiatric: She has a normal mood and affect.    ED Course  Procedures (including critical care time) DIAGNOSTIC STUDIES: Oxygen Saturation is 98% on room air, normal by my interpretation.    COORDINATION OF CARE: At 9:37 PM: Discussed treatment plan with  patient which includes lab tests. Patient agrees.   Labs Review Labs Reviewed - No data to display  Imaging Review No results found.   EKG Interpretation None      MDM   Final diagnoses:  Malingering   Pt with frequent visits to the [facility] EDs for chronic pain complaints and syncope. She was seen at [facility] earlier this evening but did not volunteer that information until I inquired. She was offered IVF and check of labs, but when I advised that I would not be treating her chronic pain, she and her spouse left the ED.  I have no concern for Emergent Medical Condition.   I personally performed the services described in this documentation, which was scribed  in my presence. The recorded information has been reviewed and is accurate.        Amy B. Karle Starch, MD 06/15/13 2134

## 2013-06-16 NOTE — ED Notes (Signed)
Pt departed prior to my being able to assess or evaluate.

## 2013-08-12 ENCOUNTER — Emergency Department: Payer: Self-pay | Admitting: Emergency Medicine

## 2013-08-12 LAB — URINALYSIS, COMPLETE
Bilirubin,UR: NEGATIVE
Blood: NEGATIVE
GLUCOSE, UR: NEGATIVE mg/dL (ref 0–75)
Ketone: NEGATIVE
Nitrite: NEGATIVE
Ph: 6 (ref 4.5–8.0)
Protein: NEGATIVE
Specific Gravity: 1.028 (ref 1.003–1.030)
WBC UR: 43 /HPF (ref 0–5)

## 2013-08-12 LAB — CBC WITH DIFFERENTIAL/PLATELET
Basophil #: 0.1 10*3/uL (ref 0.0–0.1)
Basophil %: 0.8 %
EOS PCT: 2.8 %
Eosinophil #: 0.4 10*3/uL (ref 0.0–0.7)
HCT: 46 % (ref 35.0–47.0)
HGB: 15.7 g/dL (ref 12.0–16.0)
LYMPHS PCT: 26.5 %
Lymphocyte #: 3.5 10*3/uL (ref 1.0–3.6)
MCH: 29.3 pg (ref 26.0–34.0)
MCHC: 34.1 g/dL (ref 32.0–36.0)
MCV: 86 fL (ref 80–100)
MONOS PCT: 3.8 %
Monocyte #: 0.5 x10 3/mm (ref 0.2–0.9)
NEUTROS ABS: 8.8 10*3/uL — AB (ref 1.4–6.5)
Neutrophil %: 66.1 %
Platelet: 288 10*3/uL (ref 150–440)
RBC: 5.36 10*6/uL — ABNORMAL HIGH (ref 3.80–5.20)
RDW: 13.1 % (ref 11.5–14.5)
WBC: 13.3 10*3/uL — ABNORMAL HIGH (ref 3.6–11.0)

## 2013-08-12 LAB — COMPREHENSIVE METABOLIC PANEL
ALT: 40 U/L (ref 12–78)
ANION GAP: 8 (ref 7–16)
Albumin: 4.4 g/dL (ref 3.4–5.0)
Alkaline Phosphatase: 72 U/L
BUN: 11 mg/dL (ref 7–18)
Bilirubin,Total: 0.5 mg/dL (ref 0.2–1.0)
CALCIUM: 9 mg/dL (ref 8.5–10.1)
CHLORIDE: 106 mmol/L (ref 98–107)
CO2: 24 mmol/L (ref 21–32)
CREATININE: 0.73 mg/dL (ref 0.60–1.30)
EGFR (Non-African Amer.): >60
GLUCOSE: 95 mg/dL (ref 65–99)
OSMOLALITY: 275 (ref 275–301)
Potassium: 3.7 mmol/L (ref 3.5–5.1)
SGOT(AST): 37 U/L (ref 15–37)
Sodium: 138 mmol/L (ref 136–145)
Total Protein: 8 g/dL (ref 6.4–8.2)

## 2013-08-29 ENCOUNTER — Observation Stay: Payer: Self-pay | Admitting: Surgery

## 2013-08-29 LAB — TROPONIN I: Troponin-I: <0.02 ng/mL

## 2013-08-29 LAB — COMPREHENSIVE METABOLIC PANEL
ALT: 27 U/L
ANION GAP: 9 (ref 7–16)
AST: 21 U/L (ref 15–37)
Albumin: 3.8 g/dL (ref 3.4–5.0)
Alkaline Phosphatase: 61 U/L
BUN: 9 mg/dL (ref 7–18)
Bilirubin,Total: 0.6 mg/dL (ref 0.2–1.0)
CHLORIDE: 109 mmol/L — AB (ref 98–107)
CREATININE: 0.6 mg/dL (ref 0.60–1.30)
Calcium, Total: 8.6 mg/dL (ref 8.5–10.1)
Co2: 24 mmol/L (ref 21–32)
EGFR (African American): >60
EGFR (Non-African Amer.): >60
GLUCOSE: 83 mg/dL (ref 65–99)
Osmolality: 281 (ref 275–301)
Potassium: 3.5 mmol/L (ref 3.5–5.1)
SODIUM: 142 mmol/L (ref 136–145)
Total Protein: 7.3 g/dL (ref 6.4–8.2)

## 2013-08-29 LAB — CBC
HCT: 42 % (ref 35.0–47.0)
HGB: 14.3 g/dL (ref 12.0–16.0)
MCH: 29.3 pg (ref 26.0–34.0)
MCHC: 34 g/dL (ref 32.0–36.0)
MCV: 86 fL (ref 80–100)
Platelet: 271 10*3/uL (ref 150–440)
RBC: 4.88 10*6/uL (ref 3.80–5.20)
RDW: 12.5 % (ref 11.5–14.5)
WBC: 15.5 10*3/uL — ABNORMAL HIGH (ref 3.6–11.0)

## 2013-08-29 LAB — CK TOTAL AND CKMB (NOT AT ARMC)
CK, Total: 19 U/L — ABNORMAL LOW
CK-MB: <0.5 ng/mL — ABNORMAL LOW (ref 0.5–3.6)

## 2013-08-29 LAB — HCG, QUANTITATIVE, PREGNANCY

## 2013-08-29 LAB — LIPASE, BLOOD: Lipase: 60 U/L — ABNORMAL LOW (ref 73–393)

## 2013-08-30 LAB — CBC WITH DIFFERENTIAL/PLATELET
BASOS ABS: 0 10*3/uL (ref 0.0–0.1)
Basophil %: 0.5 %
EOS PCT: 2.8 %
Eosinophil #: 0.2 10*3/uL (ref 0.0–0.7)
HCT: 39.6 % (ref 35.0–47.0)
HGB: 13.2 g/dL (ref 12.0–16.0)
LYMPHS PCT: 34.8 %
Lymphocyte #: 2.7 10*3/uL (ref 1.0–3.6)
MCH: 29.1 pg (ref 26.0–34.0)
MCHC: 33.4 g/dL (ref 32.0–36.0)
MCV: 87 fL (ref 80–100)
Monocyte #: 0.5 x10 3/mm (ref 0.2–0.9)
Monocyte %: 7 %
NEUTROS ABS: 4.2 10*3/uL (ref 1.4–6.5)
NEUTROS PCT: 54.9 %
Platelet: 234 10*3/uL (ref 150–440)
RBC: 4.54 10*6/uL (ref 3.80–5.20)
RDW: 12.6 % (ref 11.5–14.5)
WBC: 7.7 10*3/uL (ref 3.6–11.0)

## 2013-08-30 LAB — BASIC METABOLIC PANEL
Anion Gap: 8 (ref 7–16)
BUN: 9 mg/dL (ref 7–18)
CALCIUM: 8.5 mg/dL (ref 8.5–10.1)
CREATININE: 0.78 mg/dL (ref 0.60–1.30)
Chloride: 109 mmol/L — ABNORMAL HIGH (ref 98–107)
Co2: 25 mmol/L (ref 21–32)
EGFR (Non-African Amer.): >60
Glucose: 84 mg/dL (ref 65–99)
Osmolality: 281 (ref 275–301)
Potassium: 3.4 mmol/L — ABNORMAL LOW (ref 3.5–5.1)
Sodium: 142 mmol/L (ref 136–145)

## 2013-08-30 LAB — URINALYSIS, COMPLETE
BLOOD: NEGATIVE
Bacteria: NONE SEEN
Bilirubin,UR: NEGATIVE
Glucose,UR: NEGATIVE mg/dL (ref 0–75)
Leukocyte Esterase: NEGATIVE
Nitrite: NEGATIVE
PH: 6 (ref 4.5–8.0)
PROTEIN: NEGATIVE
RBC,UR: 1 /HPF (ref 0–5)
SPECIFIC GRAVITY: 1.05 (ref 1.003–1.030)
Squamous Epithelial: <1
WBC UR: 1 /HPF (ref 0–5)

## 2013-09-01 ENCOUNTER — Emergency Department: Payer: Self-pay | Admitting: Emergency Medicine

## 2013-09-01 LAB — COMPREHENSIVE METABOLIC PANEL
ALBUMIN: 3.7 g/dL (ref 3.4–5.0)
ANION GAP: 6 — AB (ref 7–16)
Alkaline Phosphatase: 64 U/L
BUN: 3 mg/dL — AB (ref 7–18)
Bilirubin,Total: 0.7 mg/dL (ref 0.2–1.0)
CO2: 27 mmol/L (ref 21–32)
CREATININE: 0.61 mg/dL (ref 0.60–1.30)
Calcium, Total: 9 mg/dL (ref 8.5–10.1)
Chloride: 107 mmol/L (ref 98–107)
EGFR (African American): >60
EGFR (Non-African Amer.): >60
Glucose: 86 mg/dL (ref 65–99)
Osmolality: 275 (ref 275–301)
Potassium: 4.1 mmol/L (ref 3.5–5.1)
SGOT(AST): 26 U/L (ref 15–37)
SGPT (ALT): 21 U/L
SODIUM: 140 mmol/L (ref 136–145)
Total Protein: 7.6 g/dL (ref 6.4–8.2)

## 2013-09-01 LAB — CK TOTAL AND CKMB (NOT AT ARMC)
CK, Total: 102 U/L
CK-MB: <0.5 ng/mL — ABNORMAL LOW (ref 0.5–3.6)

## 2013-09-01 LAB — CBC
HCT: 42.6 % (ref 35.0–47.0)
HGB: 14.2 g/dL (ref 12.0–16.0)
MCH: 29.1 pg (ref 26.0–34.0)
MCHC: 33.3 g/dL (ref 32.0–36.0)
MCV: 87 fL (ref 80–100)
PLATELETS: 247 10*3/uL (ref 150–440)
RBC: 4.88 10*6/uL (ref 3.80–5.20)
RDW: 12.8 % (ref 11.5–14.5)
WBC: 7.6 10*3/uL (ref 3.6–11.0)

## 2013-09-01 LAB — TROPONIN I: Troponin-I: <0.02 ng/mL

## 2013-09-01 LAB — PROTIME-INR
INR: 1
PROTHROMBIN TIME: 13.2 s (ref 11.5–14.7)

## 2013-09-03 ENCOUNTER — Emergency Department: Payer: Self-pay | Admitting: Emergency Medicine

## 2013-09-03 LAB — BASIC METABOLIC PANEL
Anion Gap: 8 (ref 7–16)
BUN: 6 mg/dL — ABNORMAL LOW (ref 7–18)
CALCIUM: 8.8 mg/dL (ref 8.5–10.1)
CHLORIDE: 107 mmol/L (ref 98–107)
CO2: 26 mmol/L (ref 21–32)
CREATININE: 0.76 mg/dL (ref 0.60–1.30)
EGFR (Non-African Amer.): >60
Glucose: 89 mg/dL (ref 65–99)
Osmolality: 278 (ref 275–301)
Potassium: 3.9 mmol/L (ref 3.5–5.1)
Sodium: 141 mmol/L (ref 136–145)

## 2013-09-03 LAB — CBC
HCT: 43.2 % (ref 35.0–47.0)
HGB: 14.7 g/dL (ref 12.0–16.0)
MCH: 29.9 pg (ref 26.0–34.0)
MCHC: 34.2 g/dL (ref 32.0–36.0)
MCV: 88 fL (ref 80–100)
Platelet: 273 10*3/uL (ref 150–440)
RBC: 4.93 10*6/uL (ref 3.80–5.20)
RDW: 12.4 % (ref 11.5–14.5)
WBC: 9.1 10*3/uL (ref 3.6–11.0)

## 2013-09-03 LAB — TROPONIN I: Troponin-I: <0.02 ng/mL

## 2013-09-08 ENCOUNTER — Emergency Department: Payer: Self-pay | Admitting: Emergency Medicine

## 2013-09-28 ENCOUNTER — Emergency Department: Payer: Self-pay | Admitting: Emergency Medicine

## 2013-09-28 LAB — URINALYSIS, COMPLETE
BILIRUBIN, UR: NEGATIVE
Blood: NEGATIVE
Glucose,UR: NEGATIVE mg/dL (ref 0–75)
Leukocyte Esterase: NEGATIVE
NITRITE: NEGATIVE
Ph: 6 (ref 4.5–8.0)
Specific Gravity: 1.025 (ref 1.003–1.030)
Squamous Epithelial: 2
WBC UR: 10 /HPF (ref 0–5)

## 2014-04-05 ENCOUNTER — Emergency Department: Payer: Self-pay | Admitting: Emergency Medicine

## 2014-04-21 ENCOUNTER — Emergency Department: Payer: Self-pay | Admitting: Emergency Medicine
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
## 2014-10-05 ENCOUNTER — Encounter: Payer: Self-pay | Admitting: Urgent Care

## 2014-10-05 DIAGNOSIS — Z720 Tobacco use: Secondary | ICD-10-CM | POA: Insufficient documentation

## 2014-10-05 DIAGNOSIS — Z3202 Encounter for pregnancy test, result negative: Secondary | ICD-10-CM | POA: Diagnosis not present

## 2014-10-05 DIAGNOSIS — Z7951 Long term (current) use of inhaled steroids: Secondary | ICD-10-CM | POA: Insufficient documentation

## 2014-10-05 DIAGNOSIS — Z79899 Other long term (current) drug therapy: Secondary | ICD-10-CM | POA: Diagnosis not present

## 2014-10-05 DIAGNOSIS — Z880 Allergy status to penicillin: Secondary | ICD-10-CM | POA: Diagnosis not present

## 2014-10-05 DIAGNOSIS — N12 Tubulo-interstitial nephritis, not specified as acute or chronic: Secondary | ICD-10-CM | POA: Diagnosis not present

## 2014-10-05 DIAGNOSIS — R109 Unspecified abdominal pain: Secondary | ICD-10-CM | POA: Diagnosis present

## 2014-10-05 LAB — URINALYSIS COMPLETE WITH MICROSCOPIC (ARMC ONLY)
BILIRUBIN URINE: NEGATIVE
Glucose, UA: NEGATIVE mg/dL
Hgb urine dipstick: NEGATIVE
KETONES UR: NEGATIVE mg/dL
Nitrite: NEGATIVE
Protein, ur: NEGATIVE mg/dL
Specific Gravity, Urine: 1.02 (ref 1.005–1.030)
pH: 6 (ref 5.0–8.0)

## 2014-10-05 LAB — POCT PREGNANCY, URINE: PREG TEST UR: NEGATIVE

## 2014-10-05 NOTE — ED Notes (Addendum)
Patient presents with c/o bilateral flank pain with (+) radiation into lower abd since Saturday. (+) dysuria with gross hematuria reported. PMH significant for stones and UTIs that lead to sepsis per her report. (+) fever at home with a tmax of 102.

## 2014-10-06 ENCOUNTER — Emergency Department
Admission: EM | Admit: 2014-10-06 | Discharge: 2014-10-06 | Disposition: A | Discharge status: Home / Self Care | Payer: Medicaid Other | Attending: Emergency Medicine | Admitting: Emergency Medicine

## 2014-10-06 ENCOUNTER — Emergency Department: Payer: Medicaid Other

## 2014-10-06 DIAGNOSIS — N12 Tubulo-interstitial nephritis, not specified as acute or chronic: Secondary | ICD-10-CM

## 2014-10-06 DIAGNOSIS — R109 Unspecified abdominal pain: Secondary | ICD-10-CM

## 2014-10-06 HISTORY — DX: Personal history of urinary (tract) infections: Z87.440

## 2014-10-06 LAB — BASIC METABOLIC PANEL
Anion gap: 8 (ref 5–15)
BUN: 16 mg/dL (ref 6–20)
CHLORIDE: 107 mmol/L (ref 101–111)
CO2: 23 mmol/L (ref 22–32)
Calcium: 9.2 mg/dL (ref 8.9–10.3)
Creatinine, Ser: 0.7 mg/dL (ref 0.44–1.00)
GFR calc Af Amer: >60 mL/min (ref >60)
GFR calc non Af Amer: >60 mL/min (ref >60)
Glucose, Bld: 87 mg/dL (ref 65–99)
POTASSIUM: 4.6 mmol/L (ref 3.5–5.1)
SODIUM: 138 mmol/L (ref 135–145)

## 2014-10-06 LAB — URINE DRUG SCREEN, QUALITATIVE (ARMC ONLY)
Amphetamines, Ur Screen: NOT DETECTED
BARBITURATES, UR SCREEN: NOT DETECTED
Benzodiazepine, Ur Scrn: POSITIVE — AB
CANNABINOID 50 NG, UR ~~LOC~~: NOT DETECTED
COCAINE METABOLITE, UR ~~LOC~~: NOT DETECTED
MDMA (Ecstasy)Ur Screen: NOT DETECTED
Methadone Scn, Ur: NOT DETECTED
Opiate, Ur Screen: NOT DETECTED
PHENCYCLIDINE (PCP) UR S: NOT DETECTED
TRICYCLIC, UR SCREEN: NOT DETECTED

## 2014-10-06 LAB — CBC
HEMATOCRIT: 43.5 % (ref 35.0–47.0)
Hemoglobin: 14.8 g/dL (ref 12.0–16.0)
MCH: 29.4 pg (ref 26.0–34.0)
MCHC: 33.9 g/dL (ref 32.0–36.0)
MCV: 86.7 fL (ref 80.0–100.0)
PLATELETS: 287 10*3/uL (ref 150–440)
RBC: 5.02 MIL/uL (ref 3.80–5.20)
RDW: 12.9 % (ref 11.5–14.5)
WBC: 14.4 10*3/uL — ABNORMAL HIGH (ref 3.6–11.0)

## 2014-10-06 MED ORDER — OXYCODONE-ACETAMINOPHEN 5-325 MG PO TABS
ORAL_TABLET | ORAL | Status: AC
  Administered 2014-10-06: 1 via ORAL
  Filled 2014-10-06: qty 1

## 2014-10-06 MED ORDER — CIPROFLOXACIN HCL 500 MG PO TABS: 500.0000 mg | ORAL_TABLET | Freq: Two times a day (BID) | ORAL | Status: AC

## 2014-10-06 MED ORDER — OXYCODONE-ACETAMINOPHEN 5-325 MG PO TABS: 1.0000 | ORAL_TABLET | Freq: Three times a day (TID) | ORAL | Status: DC | PRN

## 2014-10-06 MED ORDER — ONDANSETRON HCL 4 MG/2ML IJ SOLN
4.0000 mg | Freq: Once | INTRAMUSCULAR | Status: AC
  Administered 2014-10-06: 4 mg via INTRAVENOUS
  Filled 2014-10-06: qty 2

## 2014-10-06 MED ORDER — OXYCODONE-ACETAMINOPHEN 5-325 MG PO TABS
1.0000 | ORAL_TABLET | Freq: Once | ORAL | Status: AC
  Administered 2014-10-06: 1 via ORAL

## 2014-10-06 MED ORDER — TRAMADOL HCL 50 MG PO TABS: 100.0000 mg | ORAL_TABLET | Freq: Once | ORAL | Status: DC

## 2014-10-06 NOTE — Discharge Instructions (Signed)
Pyelonephritis, Adult °Pyelonephritis is a kidney infection. In general, there are 2 main types of pyelonephritis: °· Infections that come on quickly without any warning (acute pyelonephritis). °· Infections that persist for a long period of time (chronic pyelonephritis). °CAUSES  °Two main causes of pyelonephritis are: °· Bacteria traveling from the bladder to the kidney. This is a problem especially in pregnant women. The urine in the bladder can become filled with bacteria from multiple causes, including: °¨ Inflammation of the prostate gland (prostatitis). °¨ Sexual intercourse in females. °¨ Bladder infection (cystitis). °· Bacteria traveling from the bloodstream to the tissue part of the kidney. °Problems that may increase your risk of getting a kidney infection include: °· Diabetes. °· Kidney stones or bladder stones. °· Cancer. °· Catheters placed in the bladder. °· Other abnormalities of the kidney or ureter. °SYMPTOMS  °· Abdominal pain. °· Pain in the side or flank area. °· Fever. °· Chills. °· Upset stomach. °· Blood in the urine (dark urine). °· Frequent urination. °· Strong or persistent urge to urinate. °· Burning or stinging when urinating. °DIAGNOSIS  °Your caregiver may diagnose your kidney infection based on your symptoms. A urine sample may also be taken. °TREATMENT  °In general, treatment depends on how severe the infection is.  °· If the infection is mild and caught early, your caregiver may treat you with oral antibiotics and send you home. °· If the infection is more severe, the bacteria may have gotten into the bloodstream. This will require intravenous (IV) antibiotics and a hospital stay. Symptoms may include: °¨ High fever. °¨ Severe flank pain. °¨ Shaking chills. °· Even after a hospital stay, your caregiver may require you to be on oral antibiotics for a period of time. °· Other treatments may be required depending upon the cause of the infection. °HOME CARE INSTRUCTIONS  °· Take your  antibiotics as directed. Finish them even if you start to feel better. °· Make an appointment to have your urine checked to make sure the infection is gone. °· Drink enough fluids to keep your urine clear or pale yellow. °· Take medicines for the bladder if you have urgency and frequency of urination as directed by your caregiver. °SEEK IMMEDIATE MEDICAL CARE IF:  °· You have a fever or persistent symptoms for more than 2-3 days. °· You have a fever and your symptoms suddenly get worse. °· You are unable to take your antibiotics or fluids. °· You develop shaking chills. °· You experience extreme weakness or fainting. °· There is no improvement after 2 days of treatment. °MAKE SURE YOU: °· Understand these instructions. °· Will watch your condition. °· Will get help right away if you are not doing well or get worse. °Document Released: 01/23/2005 Document Revised: 07/25/2011 Document Reviewed: 06/29/2010 °[employer]® Patient Information ©2015 [employer]. This information is not intended to replace advice given to you by your health care provider. Make sure you discuss any questions you have with your health care provider. ° °

## 2014-10-06 NOTE — ED Notes (Signed)
Patient with no complaints at this time. Respirations even and unlabored. Skin warm/dry. Discharge instructions reviewed with patient at this time. Patient given opportunity to voice concerns/ask questions. IV removed per policy and band-aid applied to site. Patient discharged at this time and left Emergency Department with steady gait.  

## 2014-10-06 NOTE — ED Provider Notes (Signed)
[facility] Emergency Department Provider Note  ____________________________________________  Time seen:   I have reviewed the triage vital signs and the nursing notes.   HISTORY  Chief Complaint Flank Pain   HPI Amy Estes is a 29 y.o. female presents with bilateral flank pain 3 days patient also admits to dysuria and hematuria. Patient states that she has had UTIs in the past that is left to sepsis. Patient also admits to fever at home with a MAXIMUM TEMPERATURE of 102     Past Medical History  Diagnosis Date  . Leukemia   . Migraine   . Kidney stones   . Ovarian cyst   . PTSD (post-traumatic stress disorder)   . Anxiety   . Chronic back pain   . Gastroparesis   . GI bleed   . Kidney stones   . History of recurrent UTIs     There are no active problems to display for this patient.   Past Surgical History  Procedure Laterality Date  . Appendectomy    . Tubal ligation    . Joint replacement    . Fracture surgery      Current Outpatient Rx  Name  Route  Sig  Dispense  Refill  . albuterol (PROVENTIL HFA;VENTOLIN HFA) 108 (90 BASE) MCG/ACT inhaler   Inhalation   Inhale into the lungs every 6 (six) hours as needed for wheezing or shortness of breath.         Marland Kitchen albuterol (PROVENTIL) (2.5 MG/3ML) 0.083% nebulizer solution   Nebulization   Take 2.5 mg by nebulization every 6 (six) hours as needed for wheezing or shortness of breath.         . ALPRAZolam (XANAX) 1 MG tablet   Oral   Take 1 mg by mouth at bedtime as needed for anxiety.         . budesonide-formoterol (SYMBICORT) 160-4.5 MCG/ACT inhaler   Inhalation   Inhale 2 puffs into the lungs 2 (two) times daily.         Marland Kitchen desipramine (NORPRAMIN) 50 MG tablet   Oral   Take 50 mg by mouth daily.         Marland Kitchen dicyclomine (BENTYL) 10 MG capsule   Oral   Take 10 mg by mouth 4 (four) times daily -  before meals and at bedtime.         . gabapentin (NEURONTIN) 100 MG  capsule   Oral   Take 100 mg by mouth 3 (three) times daily.         . metoCLOPramide (REGLAN) 10 MG tablet   Oral   Take 10 mg by mouth 4 (four) times daily.         . ondansetron (ZOFRAN ODT) 8 MG disintegrating tablet      8mg  ODT q8 hours prn nausea   8 tablet   0   . ondansetron (ZOFRAN) 4 MG tablet   Oral   Take 4 mg by mouth every 8 (eight) hours as needed for nausea or vomiting.         . ondansetron (ZOFRAN) 4 MG tablet   Oral   Take 1 tablet (4 mg total) by mouth every 8 (eight) hours as needed for nausea or vomiting.   12 tablet   0   . oxyCODONE-acetaminophen (ROXICET) 5-325 MG/5ML solution   Oral   Take by mouth every 4 (four) hours as needed for severe pain.         . pantoprazole (  PROTONIX) 40 MG tablet   Oral   Take 40 mg by mouth daily.         . phenylephrine-chlorpheniramine-dihydrocodeine (PANCOF-PD) 7.5-2-3 MG/5ML syrup   Oral   Take 5 mLs by mouth every 6 (six) hours as needed for cough.         . promethazine (PHENERGAN) 25 MG tablet   Oral   Take 25 mg by mouth every 6 (six) hours as needed for nausea or vomiting.         . topiramate (TOPAMAX) 25 MG tablet   Oral   Take 25 mg by mouth 2 (two) times daily.           Allergies Compazine; Morphine and related; Norco; Penicillins; Rocephin; Sulfa antibiotics; and Toradol  No family history on file.  Social History Social History  Substance Use Topics  . Smoking status: Current Every Day Smoker  . Smokeless tobacco: Never Used  . Alcohol Use: No    Review of Systems  Constitutional: Negative for fever. Eyes: Negative for visual changes. ENT: Negative for sore throat. Cardiovascular: Negative for chest pain. Respiratory: Negative for shortness of breath. Gastrointestinal: Negative for abdominal pain, vomiting and diarrhea. Positive for bilateral flank pain Genitourinary: Negative for dysuria. Musculoskeletal: Negative for back pain. Skin: Negative for  rash. Neurological: Negative for headaches, focal weakness or numbness.   10-point ROS otherwise negative.  ____________________________________________   PHYSICAL EXAM:  VITAL SIGNS: ED Triage Vitals  Enc Vitals Group     BP 10/05/14 2313 132/86 mmHg     Pulse Rate 10/05/14 2313 110     Resp 10/05/14 2313 18     Temp 10/05/14 2313 98.7 F (37.1 C)     Temp Source 10/05/14 2313 Oral     SpO2 10/05/14 2313 100 %     Weight 10/05/14 2313 145 lb (65.772 kg)     Height 10/05/14 2313 5\' 6"  (1.676 m)     Head Cir --      Peak Flow --      Pain Score 10/05/14 2314 8     Pain Loc --      Pain Edu? --      Excl. in [place]? --      Constitutional: Alert and oriented. Well appearing and in no distress. Eyes: Conjunctivae are normal. PERRL. Normal extraocular movements. ENT   Head: Normocephalic and atraumatic.   Nose: No congestion/rhinnorhea.   Mouth/Throat: Mucous membranes are moist.   Neck: No stridor. Hematological/Lymphatic/Immunilogical: No cervical lymphadenopathy. Cardiovascular: Normal rate, regular rhythm. Normal and symmetric distal pulses are present in all extremities. No murmurs, rubs, or gallops. Respiratory: Normal respiratory effort without tachypnea nor retractions. Breath sounds are clear and equal bilaterally. No wheezes/rales/rhonchi. Gastrointestinal: Soft and nontender. No distention. Positive bilateral CVA tenderness Genitourinary: deferred Musculoskeletal: Nontender with normal range of motion in all extremities. No joint effusions.  No lower extremity tenderness nor edema. Neurologic:  Normal speech and language. No gross focal neurologic deficits are appreciated. Speech is normal.  Skin:  Skin is warm, dry and intact. No rash noted. Psychiatric: Mood and affect are normal. Speech and behavior are normal. Patient exhibits appropriate insight and judgment.  ____________________________________________    LABS (pertinent  positives/negatives)  Labs Reviewed  CBC - Abnormal; Notable for the following:    WBC 14.4 (*)    All other components within normal limits  URINALYSIS COMPLETEWITH MICROSCOPIC ([facility] ONLY) - Abnormal; Notable for the following:    Color, Urine YELLOW (*)  APPearance CLEAR (*)    Leukocytes, UA 1+ (*)    Bacteria, UA RARE (*)    Squamous Epithelial / LPF 0-5 (*)    All other components within normal limits  URINE DRUG SCREEN, QUALITATIVE ([facility] ONLY) - Abnormal; Notable for the following:    Benzodiazepine, Ur Scrn POSITIVE (*)    All other components within normal limits  URINE CULTURE  BASIC METABOLIC PANEL  POCT PREGNANCY, URINE        RADIOLOGY  CT RENAL STONE STUDY (Final result) Result time: 10/06/14 03:51:32   Final result by Rad Results In Interface (10/06/14 03:51:32)   Narrative:   CLINICAL DATA: Bilateral flank pain for 2 days. Hematuria.  EXAM: CT ABDOMEN AND PELVIS WITHOUT CONTRAST  TECHNIQUE: Multidetector CT imaging of the abdomen and pelvis was performed following the standard protocol without IV contrast.  COMPARISON: 04/21/2014  FINDINGS: Multiple collecting system calculi are present bilaterally. There is no ureteral calculus. There is no hydronephrosis or ureteral dilatation. No acute inflammatory changes are evident in the abdomen or pelvis. There are unremarkable unenhanced appearances of the visible portions of the liver, spleen, pancreas and adrenals. The abdominal aorta is normal in caliber. There is no atherosclerotic calcification. There is no adenopathy in the abdomen or pelvis. There are normal appearances of the stomach, small bowel and colon. There is prior appendectomy. The uterus and ovaries appear unremarkable.  IMPRESSION: Bilateral nephrolithiasis. No acute findings are evident in the abdomen or pelvis.   Electronically Signed By: Andreas Newport M.D. On: 10/06/2014 03:51          INITIAL IMPRESSION  / ASSESSMENT AND PLAN / ED COURSE  Pertinent labs & imaging results that were available during my care of the patient were reviewed by me and considered in my medical decision making (see chart for details).  Patient with elevated serum white blood cell 14.4. Urine nonspecific 1+ leukocytes rare bacteria however with a normal white blood cell count and no nitrite. However we will send a urine culture and treat empirically while we await those results. In addition CT scan of the abdomen and pelvis revealed no gross abnormality  ____________________________________________   FINAL CLINICAL IMPRESSION(S) / ED DIAGNOSES  Final diagnoses:  Left flank pain      Gregor Hams, MD 10/06/14 [phone]

## 2014-10-07 LAB — URINE CULTURE

## 2014-10-31 ENCOUNTER — Encounter: Payer: Self-pay | Admitting: *Deleted

## 2014-10-31 ENCOUNTER — Emergency Department
Admission: EM | Admit: 2014-10-31 | Discharge: 2014-11-01 | Disposition: A | Discharge status: Home / Self Care | Payer: Medicaid Other | Attending: Emergency Medicine | Admitting: Emergency Medicine

## 2014-10-31 DIAGNOSIS — R339 Retention of urine, unspecified: Secondary | ICD-10-CM | POA: Insufficient documentation

## 2014-10-31 DIAGNOSIS — F419 Anxiety disorder, unspecified: Secondary | ICD-10-CM | POA: Insufficient documentation

## 2014-10-31 DIAGNOSIS — Z720 Tobacco use: Secondary | ICD-10-CM | POA: Diagnosis not present

## 2014-10-31 DIAGNOSIS — Z880 Allergy status to penicillin: Secondary | ICD-10-CM | POA: Insufficient documentation

## 2014-10-31 DIAGNOSIS — Z3202 Encounter for pregnancy test, result negative: Secondary | ICD-10-CM | POA: Insufficient documentation

## 2014-10-31 DIAGNOSIS — Z792 Long term (current) use of antibiotics: Secondary | ICD-10-CM | POA: Diagnosis not present

## 2014-10-31 DIAGNOSIS — Z79899 Other long term (current) drug therapy: Secondary | ICD-10-CM | POA: Insufficient documentation

## 2014-10-31 DIAGNOSIS — R300 Dysuria: Secondary | ICD-10-CM | POA: Insufficient documentation

## 2014-10-31 DIAGNOSIS — R109 Unspecified abdominal pain: Secondary | ICD-10-CM | POA: Diagnosis not present

## 2014-10-31 LAB — COMPREHENSIVE METABOLIC PANEL
ALBUMIN: 4.2 g/dL (ref 3.5–5.0)
ALK PHOS: 60 U/L (ref 38–126)
ALT: 36 U/L (ref 14–54)
ANION GAP: 10 (ref 5–15)
AST: 32 U/L (ref 15–41)
BILIRUBIN TOTAL: 0.3 mg/dL (ref 0.3–1.2)
BUN: 9 mg/dL (ref 6–20)
CALCIUM: 9.3 mg/dL (ref 8.9–10.3)
CO2: 28 mmol/L (ref 22–32)
Chloride: 103 mmol/L (ref 101–111)
Creatinine, Ser: 0.9 mg/dL (ref 0.44–1.00)
GLUCOSE: 89 mg/dL (ref 65–99)
Potassium: 4.1 mmol/L (ref 3.5–5.1)
Sodium: 141 mmol/L (ref 135–145)
TOTAL PROTEIN: 7 g/dL (ref 6.5–8.1)

## 2014-10-31 LAB — CBC
HCT: 39.2 % (ref 35.0–47.0)
HEMOGLOBIN: 13.7 g/dL (ref 12.0–16.0)
MCH: 30 pg (ref 26.0–34.0)
MCHC: 34.9 g/dL (ref 32.0–36.0)
MCV: 86.2 fL (ref 80.0–100.0)
Platelets: 238 10*3/uL (ref 150–440)
RBC: 4.56 MIL/uL (ref 3.80–5.20)
RDW: 13.2 % (ref 11.5–14.5)
WBC: 10 10*3/uL (ref 3.6–11.0)

## 2014-10-31 LAB — URINALYSIS COMPLETE WITH MICROSCOPIC (ARMC ONLY)
BILIRUBIN URINE: NEGATIVE
Bacteria, UA: NONE SEEN
GLUCOSE, UA: NEGATIVE mg/dL
HGB URINE DIPSTICK: NEGATIVE
Ketones, ur: NEGATIVE mg/dL
Leukocytes, UA: NEGATIVE
NITRITE: NEGATIVE
Protein, ur: NEGATIVE mg/dL
SPECIFIC GRAVITY, URINE: 1.011 (ref 1.005–1.030)
pH: 6 (ref 5.0–8.0)

## 2014-10-31 LAB — LIPASE, BLOOD: Lipase: 16 U/L — ABNORMAL LOW (ref 22–51)

## 2014-10-31 LAB — PREGNANCY, URINE: Preg Test, Ur: NEGATIVE

## 2014-10-31 NOTE — ED Notes (Signed)
Pt reports urinary retention all day today. Recently seen for kidney infection, finished antibiotics. Pt has bilateral swelling to ankles. Pain to bilateral flank area and lower abdomen.

## 2014-10-31 NOTE — ED Notes (Signed)
1700Ml clear yellow urine removed from catheter, 926mL removed, then clamped, with additional 830mL obtained after clamp removed after 30 minutes. Pt was bladder scanned prior to foley insertion with over 1045mL present on bladder scan.

## 2014-10-31 NOTE — ED Provider Notes (Signed)
[facility] Emergency Department Provider Note  ____________________________________________  Time seen: On arrival  I have reviewed the triage vital signs and the nursing notes.   HISTORY  Chief Complaint Urinary Retention and Abdominal Pain    HPI Amy Estes is a 29 y.o. female who presents with complaints of urinary retention. Patient reports she has not urinated since last night. She has never had urinary retention likely this before.She denies fevers chills. She reports suprapubic discomfort. She denies vaginal discharge. She reports she was recently treated for a kidney infection. No nausea no vomiting     Past Medical History  Diagnosis Date  . Leukemia   . Migraine   . Kidney stones   . Ovarian cyst   . PTSD (post-traumatic stress disorder)   . Anxiety   . Chronic back pain   . Gastroparesis   . GI bleed   . Kidney stones   . History of recurrent UTIs     There are no active problems to display for this patient.   Past Surgical History  Procedure Laterality Date  . Appendectomy    . Tubal ligation    . Joint replacement    . Fracture surgery      Current Outpatient Rx  Name  Route  Sig  Dispense  Refill  . albuterol (PROVENTIL HFA;VENTOLIN HFA) 108 (90 BASE) MCG/ACT inhaler   Inhalation   Inhale into the lungs every 6 (six) hours as needed for wheezing or shortness of breath.         Marland Kitchen albuterol (PROVENTIL) (2.5 MG/3ML) 0.083% nebulizer solution   Nebulization   Take 2.5 mg by nebulization every 6 (six) hours as needed for wheezing or shortness of breath.         . ALPRAZolam (XANAX) 1 MG tablet   Oral   Take 1 mg by mouth at bedtime as needed for anxiety.         . budesonide-formoterol (SYMBICORT) 160-4.5 MCG/ACT inhaler   Inhalation   Inhale 2 puffs into the lungs 2 (two) times daily.         Marland Kitchen desipramine (NORPRAMIN) 50 MG tablet   Oral   Take 50 mg by mouth daily.         Marland Kitchen dicyclomine (BENTYL) 10 MG  capsule   Oral   Take 10 mg by mouth 4 (four) times daily -  before meals and at bedtime.         . gabapentin (NEURONTIN) 100 MG capsule   Oral   Take 100 mg by mouth 3 (three) times daily.         . metoCLOPramide (REGLAN) 10 MG tablet   Oral   Take 10 mg by mouth 4 (four) times daily.         . ondansetron (ZOFRAN ODT) 8 MG disintegrating tablet      8mg  ODT q8 hours prn nausea   8 tablet   0   . ondansetron (ZOFRAN) 4 MG tablet   Oral   Take 4 mg by mouth every 8 (eight) hours as needed for nausea or vomiting.         . ondansetron (ZOFRAN) 4 MG tablet   Oral   Take 1 tablet (4 mg total) by mouth every 8 (eight) hours as needed for nausea or vomiting.   12 tablet   0   . oxyCODONE-acetaminophen (PERCOCET/ROXICET) 5-325 MG per tablet   Oral   Take 1 tablet by mouth every 8 (eight)  hours as needed for severe pain.   12 tablet   0   . oxyCODONE-acetaminophen (ROXICET) 5-325 MG/5ML solution   Oral   Take by mouth every 4 (four) hours as needed for severe pain.         . pantoprazole (PROTONIX) 40 MG tablet   Oral   Take 40 mg by mouth daily.         . phenylephrine-chlorpheniramine-dihydrocodeine (PANCOF-PD) 7.5-2-3 MG/5ML syrup   Oral   Take 5 mLs by mouth every 6 (six) hours as needed for cough.         . promethazine (PHENERGAN) 25 MG tablet   Oral   Take 25 mg by mouth every 6 (six) hours as needed for nausea or vomiting.         . topiramate (TOPAMAX) 25 MG tablet   Oral   Take 25 mg by mouth 2 (two) times daily.           Allergies Compazine; Morphine and related; Norco; Penicillins; Rocephin; Sulfa antibiotics; and Toradol  No family history on file.  Social History Social History  Substance Use Topics  . Smoking status: Current Every Day Smoker  . Smokeless tobacco: Never Used  . Alcohol Use: No    Review of Systems  Constitutional: Negative for fever. Eyes: Negative for visual changes. ENT: Negative for sore  throat Cardiovascular: Negative for chest pain. Respiratory: Negative for shortness of breath. Gastrointestinal: Negative for abdominal pain, vomiting and diarrhea. Genitourinary: Mild dysuria Musculoskeletal: Negative for back pain. Skin: Negative for rash. Neurological: Negative for headaches or focal weakness Psychiatric: Positive for anxiety    ____________________________________________   PHYSICAL EXAM:  VITAL SIGNS: ED Triage Vitals  Enc Vitals Group     BP 10/31/14 1954 155/97 mmHg     Pulse Rate 10/31/14 1954 103     Resp 10/31/14 1954 16     Temp 10/31/14 1954 98.4 F (36.9 C)     Temp src --      SpO2 10/31/14 1954 99 %     Weight 10/31/14 1954 150 lb (68.04 kg)     Height 10/31/14 1954 5\' 7"  (1.702 m)     Head Cir --      Peak Flow --      Pain Score 10/31/14 1952 10     Pain Loc --      Pain Edu? --      Excl. in [place]? --      Constitutional: Alert and oriented. Well appearing and in no distress. Eyes: Conjunctivae are normal.  ENT   Head: Normocephalic and atraumatic.   Mouth/Throat: Mucous membranes are moist. Cardiovascular: Normal rate, regular rhythm. Normal and symmetric distal pulses are present in all extremities. No murmurs, rubs, or gallops. Respiratory: Normal respiratory effort without tachypnea nor retractions. Breath sounds are clear and equal bilaterally.  Gastrointestinal: Swelling in the superpubic region.. No distention. There is no CVA tenderness. Genitourinary: deferred Musculoskeletal: Nontender with normal range of motion in all extremities. No lower extremity tenderness nor edema. Neurologic:  Normal speech and language. No gross focal neurologic deficits are appreciated. Skin:  Skin is warm, dry and intact. No rash noted. Psychiatric: Mood and affect are normal. Patient exhibits appropriate insight and judgment.  ____________________________________________    LABS (pertinent positives/negatives)  Labs Reviewed   LIPASE, BLOOD - Abnormal; Notable for the following:    Lipase 16 (*)    All other components within normal limits  URINALYSIS COMPLETEWITH MICROSCOPIC ([facility] ONLY) -  Abnormal; Notable for the following:    Color, Urine YELLOW (*)    APPearance CLEAR (*)    Squamous Epithelial / LPF 0-5 (*)    All other components within normal limits  COMPREHENSIVE METABOLIC PANEL  CBC  PREGNANCY, URINE  POC URINE PREG, ED    ____________________________________________   EKG  None  ____________________________________________    RADIOLOGY I have personally reviewed any xrays that were ordered on this patient: Bladder scan shows greater than 1000 mL  ____________________________________________   PROCEDURES  Procedure(s) performed: none  Critical Care performed: none  ____________________________________________   INITIAL IMPRESSION / ASSESSMENT AND PLAN / ED COURSE  Pertinent labs & imaging results that were available during my care of the patient were reviewed by me and considered in my medical decision making (see chart for details).  Foley catheter inserted by nurse given bladder scan results. Greater than thousand ML such yellow urine. Labs are unremarkable. Afebrile. Patient had recent CT scan within the last few weeks which showed no intra abdominal abnormalities  Urinalysis unremarkable. I will send urine culture and cover her with antibiotics. Patient will go home with Foley leg bag and follow-up with urology. Return precautions discussed with patient and her mother  ____________________________________________   FINAL CLINICAL IMPRESSION(S) / ED DIAGNOSES  Final diagnoses:  Urinary retention     Lavonia Drafts, MD 11/01/14 [phone]

## 2014-10-31 NOTE — ED Notes (Signed)
Pt states has pelvic pain that radiates to bilateral lower back. Pt states has not urinated since 2200 yesterday. Pt states "i have kidney problems, and i feel like i sit there for like twenty minutes before i can get even a little out." pt denies fever, denies nausea. Skin normal color warm and dry.

## 2014-11-01 MED ORDER — CIPROFLOXACIN HCL 500 MG PO TABS: 500.0000 mg | ORAL_TABLET | Freq: Two times a day (BID) | ORAL | Status: AC

## 2014-11-01 NOTE — ED Notes (Signed)
Pt sleeping. 

## 2014-11-01 NOTE — Discharge Instructions (Signed)
Acute Urinary Retention °Acute urinary retention is the temporary inability to urinate. This is an uncommon problem in women. It can be caused by: °· Infection. °· A side effect of a medicine. °· A problem in a nearby organ that presses or squeezes on the bladder or the urethra (the tube that drains the bladder). °· Psychological problems. °·  Surgery on your bladder, urethra, or pelvic organs that causes obstruction to the outflow of urine from your bladder. °HOME CARE INSTRUCTIONS  °If you are sent home with a Foley catheter and a drainage system, you will need to discuss the best course of action with your health care provider. While the catheter is in, maintain a good intake of fluids. Keep the drainage bag emptied and lower than your catheter. This is so that contaminated urine will not flow back into your bladder, which could lead to a urinary tract infection. °There are two main types of drainage bags. One is a large bag that usually is used at night. It has a good capacity that will allow you to sleep through the night without having to empty it. The second type is called a leg bag. It has a smaller capacity so it needs to be emptied more frequently. However, the main advantage is that it can be attached by a leg strap and goes underneath your clothing, allowing you the freedom to move about or leave your home. °Only take over-the-counter or prescription medicines for pain, discomfort, or fever as directed by your health care provider.  °SEEK MEDICAL CARE IF: °· You develop a low-grade fever. °· You experience spasms or leakage of urine with the spasms. °SEEK IMMEDIATE MEDICAL CARE IF:  °· You develop chills or fever. °· Your catheter stops draining urine. °· Your catheter falls out. °· You start to develop increased bleeding that does not respond to rest and increased fluid intake. °MAKE SURE YOU: °· Understand these instructions. °· Will watch your condition. °· Will get help right away if you are not  doing well or get worse. °Document Released: 01/22/2006 Document Revised: 11/13/2012 Document Reviewed: 07/04/2012 °[employer]® Patient Information ©2015 [employer]. This information is not intended to replace advice given to you by your health care provider. Make sure you discuss any questions you have with your health care provider. ° °

## 2014-11-03 LAB — URINE CULTURE: CULTURE: NO GROWTH
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
## 2016-02-21 IMAGING — CT CT RENAL STONE PROTOCOL
1 of 3 series · 2 of 32 positions shown, 5 images · non-contrast
Comparison: 04/21/2014

CLINICAL DATA: Bilateral flank pain for 2 days.  Hematuria.

EXAM:
CT ABDOMEN AND PELVIS WITHOUT CONTRAST
TECHNIQUE: Multidetector CT imaging of the abdomen and pelvis was performed
following the standard protocol without IV contrast.

[Series 6: lung · axial · 0.68mm/px · z∈[-114,-94]mm · 2 of 12 slices shown, 5 images]
[im 4/12  soft-tissue]
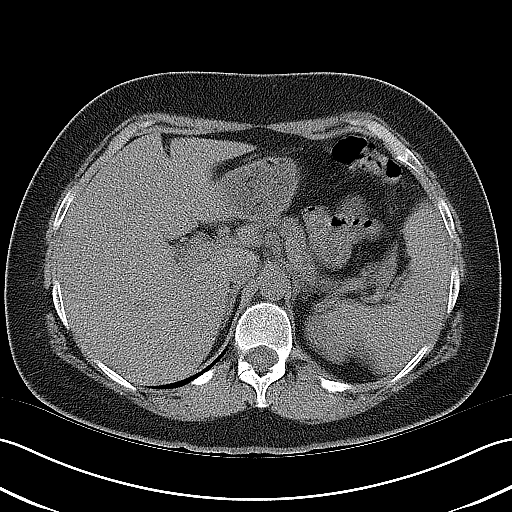
[im 4/12  lung]
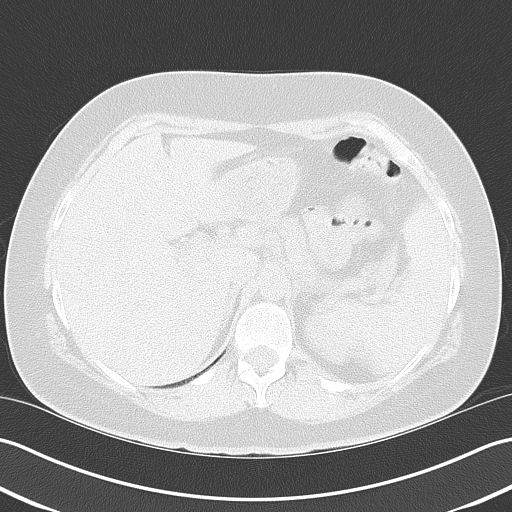
[im 4/12  bone]
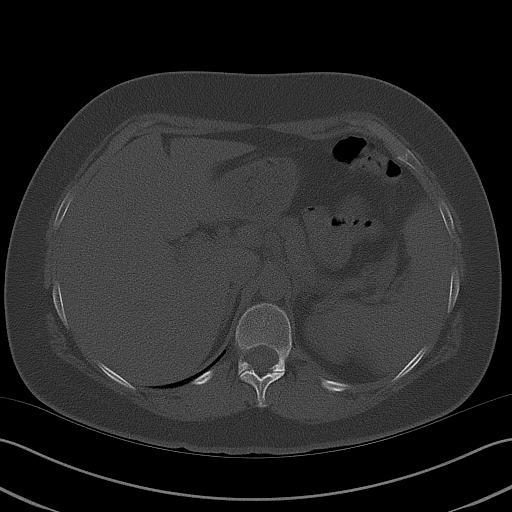
[im 8/12  soft-tissue]
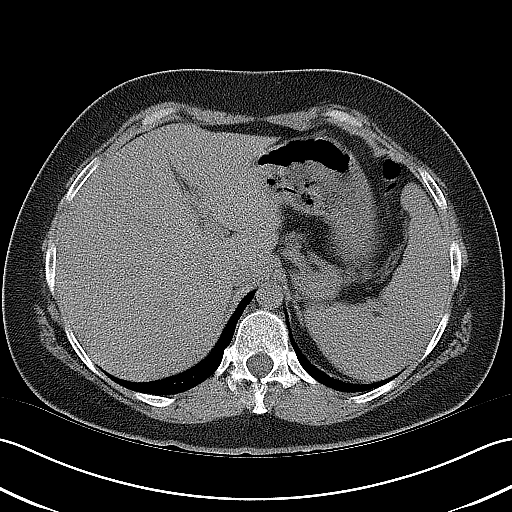
[im 8/12  lung]
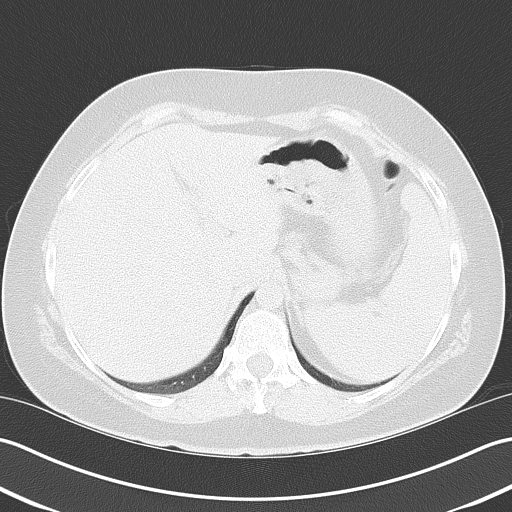

[2 of 32 positions shown; findings below may reference images not displayed]

FINDINGS: Multiple collecting system calculi are present bilaterally. There is
no ureteral calculus. There is no hydronephrosis or ureteral
dilatation. No acute inflammatory changes are evident in the abdomen
or pelvis. There are unremarkable unenhanced appearances of the
visible portions of the liver, spleen, pancreas and adrenals. The
abdominal aorta is normal in caliber. There is no atherosclerotic
calcification. There is no adenopathy in the abdomen or pelvis.
There are normal appearances of the stomach, small bowel and colon.
There is prior appendectomy. The uterus and ovaries appear
unremarkable.
IMPRESSION: Bilateral nephrolithiasis. No acute findings are evident in the
abdomen or pelvis.

## 2020-04-04 ENCOUNTER — Other Ambulatory Visit: Payer: Self-pay

## 2020-04-04 ENCOUNTER — Encounter (HOSPITAL_BASED_OUTPATIENT_CLINIC_OR_DEPARTMENT_OTHER): Payer: Self-pay

## 2020-04-04 ENCOUNTER — Emergency Department (HOSPITAL_BASED_OUTPATIENT_CLINIC_OR_DEPARTMENT_OTHER)
Admission: EM | Admit: 2020-04-04 | Discharge: 2020-04-04 | Disposition: A | Discharge status: Home / Self Care | Payer: Medicaid Other | Attending: Emergency Medicine | Admitting: Emergency Medicine

## 2020-04-04 DIAGNOSIS — F17200 Nicotine dependence, unspecified, uncomplicated: Secondary | ICD-10-CM | POA: Insufficient documentation

## 2020-04-04 DIAGNOSIS — M25551 Pain in right hip: Secondary | ICD-10-CM

## 2020-04-04 DIAGNOSIS — Z96641 Presence of right artificial hip joint: Secondary | ICD-10-CM | POA: Insufficient documentation

## 2020-04-04 MED ORDER — MORPHINE SULFATE (PF) 4 MG/ML IV SOLN
4.0000 mg | Freq: Once | INTRAVENOUS | Status: AC
  Administered 2020-04-04: 4 mg via INTRAMUSCULAR
  Filled 2020-04-04: qty 1

## 2020-04-04 NOTE — ED Triage Notes (Signed)
Pt arrives with worsening pain over the last week to her right hip. Pt states that she is supposed to have surgery on this hip in the near future.

## 2020-04-04 NOTE — ED Provider Notes (Signed)
[facility] Provider Note  CSN: [id number] Arrival date & time: 04/04/20 1106    History Chief Complaint  Patient presents with  . Hip Pain    HPI  Amy Estes is a 35 y.o. 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 She has seen numerous orthopedic doctors and recently had one that has planned hip arthroplasty. She has been told she has to stop smoking and decrease her opiate intake in order to proceed with the surgery. She is currently taking Percocet 10/325mg  four/day down from six/day. Over the last several days she has had worsening pain in R hip, radiates into her back and down her leg. Not associated with injury. No incontinence, fever, numbness or weakness. She is already taking tizanidine, does not tolerate NSAIDs due to her kidneys.    Past Medical History:  Diagnosis Date  . Anxiety   . Chronic back pain   . Gastroparesis   . GI bleed   . History of recurrent UTIs   . Kidney stones   . Kidney stones   . Leukemia ([place])   . Migraine   . Ovarian cyst   . PTSD (post-traumatic stress disorder)     Past Surgical History:  Procedure Laterality Date  . APPENDECTOMY    . FRACTURE SURGERY    . JOINT REPLACEMENT    . TUBAL LIGATION      No family history on file.  Social History   Tobacco Use  . Smoking status: Current Every Day Smoker  . Smokeless tobacco: Never Used  Substance Use Topics  . Alcohol use: No  . Drug use: No     Home Medications Prior to Admission medications   Medication Sig Start Date End Date Taking? Authorizing Provider  albuterol (PROVENTIL HFA;VENTOLIN HFA) 108 (90 BASE) MCG/ACT inhaler Inhale into the lungs every 6 (six) hours as needed for wheezing or shortness of breath.    [provider]  albuterol (PROVENTIL) (2.5 MG/3ML) 0.083% nebulizer solution Take 2.5 mg by nebulization every 6 (six) hours as  needed for wheezing or shortness of breath.    [provider]  ALPRAZolam Duanne Moron) 1 MG tablet Take 1 mg by mouth at bedtime as needed for anxiety.    [provider]  budesonide-formoterol (SYMBICORT) 160-4.5 MCG/ACT inhaler Inhale 2 puffs into the lungs 2 (two) times daily.    [provider]  desipramine (NORPRAMIN) 50 MG tablet Take 50 mg by mouth daily.    [provider]  dicyclomine (BENTYL) 10 MG capsule Take 10 mg by mouth 4 (four) times daily -  before meals and at bedtime.    [provider]  gabapentin (NEURONTIN) 100 MG capsule Take 100 mg by mouth 3 (three) times daily.    [provider]  metoCLOPramide (REGLAN) 10 MG tablet Take 10 mg by mouth 4 (four) times daily.    [provider]  ondansetron (ZOFRAN ODT) 8 MG disintegrating tablet 8mg  ODT q8 hours prn nausea 12/20/12   Palumbo, April, MD  ondansetron (ZOFRAN) 4 MG tablet Take 4 mg by mouth every 8 (eight) hours as needed for nausea or vomiting.    [provider]  ondansetron (ZOFRAN) 4 MG tablet Take 1 tablet (4 mg total) by mouth every 8 (eight) hours as needed for nausea or vomiting. 01/01/13   Truddie Hidden, MD  oxyCODONE-acetaminophen (PERCOCET/ROXICET) 5-325 MG per tablet Take  1 tablet by mouth every 8 (eight) hours as needed for severe pain. 10/06/14   Gregor Hams, MD  oxyCODONE-acetaminophen (ROXICET) [phone] MG/5ML solution Take by mouth every 4 (four) hours as needed for severe pain.    [provider]  pantoprazole (PROTONIX) 40 MG tablet Take 40 mg by mouth daily.    [provider]  phenylephrine-chlorpheniramine-dihydrocodeine (PANCOF-PD) 7.5-2-3 MG/5ML syrup Take 5 mLs by mouth every 6 (six) hours as needed for cough.    [provider]  promethazine (PHENERGAN) 25 MG tablet Take 25 mg by mouth every 6 (six) hours as needed for nausea or vomiting.    [provider]  topiramate (TOPAMAX) 25 MG tablet  Take 25 mg by mouth 2 (two) times daily.    [provider]     Allergies    Ciprofloxacin, Clindamycin, Sumatriptan, Tamsulosin hcl, Compazine [prochlorperazine], Penicillins, Rocephin [ceftriaxone sodium in dextrose], Sulfa antibiotics, Toradol [ketorolac tromethamine], and Rizatriptan benzoate   Review of Systems   Review of Systems A comprehensive review of systems was completed and negative except as noted in HPI.   Physical Exam BP (!) 129/95 (BP Location: Right Arm)   Pulse 92   Temp 98.3 F (36.8 C) (Oral)   Resp 18   Ht 5\' 6"  (1.676 m)   Wt 78 kg   LMP 03/28/2020   SpO2 96%   BMI 27.76 kg/m   Physical Exam Vitals and nursing note reviewed.  HENT:     Head: Normocephalic.     Nose: Nose normal.  Eyes:     Extraocular Movements: Extraocular movements intact.  Pulmonary:     Effort: Pulmonary effort is normal.  Musculoskeletal:        General: Tenderness (R hip and R lumbar paraspinal muscles) present. Normal range of motion.     Cervical back: Neck supple.     Comments: Pain with ROM of R hip, but no deformity; NVI  Skin:    Findings: No rash (on exposed skin).  Neurological:     Mental Status: She is alert and oriented to person, place, and time.  Psychiatric:        Mood and Affect: Mood normal.      ED Results / Procedures / Treatments   Labs (all labs ordered are listed, but only abnormal results are displayed) Labs Reviewed - No data to display  EKG None   Radiology No results found.  Procedures Procedures  Medications Ordered in the ED Medications  morphine 4 MG/ML injection 4 mg (4 mg Intramuscular Given 04/04/20 1308)     MDM Rules/Calculators/A&P MDM Patient offered IM morphine to help control her pain while here however she is already on all other modalities I could prescribe as an outpatient. Advised close follow up with Ortho.  ED Course  I have reviewed the triage vital signs and the nursing notes.  Pertinent labs  & imaging results that were available during my care of the patient were reviewed by me and considered in my medical decision making (see chart for details).     Final Clinical Impression(s) / ED Diagnoses Final diagnoses:  Right hip pain    Rx / DC Orders ED Discharge Orders    None       Truddie Hidden, MD 04/04/20 1334

## 2022-02-27 ENCOUNTER — Other Ambulatory Visit: Payer: Self-pay

## 2022-02-27 ENCOUNTER — Encounter (HOSPITAL_BASED_OUTPATIENT_CLINIC_OR_DEPARTMENT_OTHER): Payer: Self-pay

## 2022-02-27 ENCOUNTER — Emergency Department (HOSPITAL_BASED_OUTPATIENT_CLINIC_OR_DEPARTMENT_OTHER)
Admission: EM | Admit: 2022-02-27 | Discharge: 2022-02-27 | Disposition: A | Discharge status: Home / Self Care | Payer: Worker's Compensation | Attending: Emergency Medicine | Admitting: Emergency Medicine

## 2022-02-27 ENCOUNTER — Emergency Department (HOSPITAL_BASED_OUTPATIENT_CLINIC_OR_DEPARTMENT_OTHER): Payer: Worker's Compensation

## 2022-02-27 DIAGNOSIS — W010XXA Fall on same level from slipping, tripping and stumbling without subsequent striking against object, initial encounter: Secondary | ICD-10-CM | POA: Insufficient documentation

## 2022-02-27 DIAGNOSIS — S8391XA Sprain of unspecified site of right knee, initial encounter: Secondary | ICD-10-CM | POA: Diagnosis not present

## 2022-02-27 DIAGNOSIS — M25551 Pain in right hip: Secondary | ICD-10-CM | POA: Diagnosis present

## 2022-02-27 DIAGNOSIS — T148XXA Other injury of unspecified body region, initial encounter: Secondary | ICD-10-CM

## 2022-02-27 LAB — PREGNANCY, URINE: Preg Test, Ur: NEGATIVE

## 2022-02-27 MED ORDER — HYDROCODONE-ACETAMINOPHEN 5-325 MG PO TABS
1.0000 | ORAL_TABLET | Freq: Once | ORAL | Status: AC
  Administered 2022-02-27: 1 via ORAL
  Filled 2022-02-27: qty 1

## 2022-02-27 NOTE — Discharge Instructions (Addendum)
The x-rays did not show any broken bones or dislocation. Use the knee immobilizer and crutches for comfort.  Follow up with your orthopedic doctor to be rechecked.

## 2022-02-27 NOTE — ED Provider Notes (Signed)
[name] Provider Note   CSN: [id number] Arrival date & time: 02/27/22  1846     History  Chief Complaint  Patient presents with   [name]    Amy Estes is a 37 y.o. female.   Fall     Patient has history of avascular necrosis of her hip related to leukemia.  Patient also has history of right total hip replacement.  Patient presents to the ED for evaluation after a fall today at work.  Patient states there was a cart bed that was not secured and she ended up slipping and falling on her right side.  Patient states her leg was bent and she has had pain in her right hip right knee and right lower leg since that time today.  It hurts to bear weight.  Home Medications Prior to Admission medications   Medication Sig Start Date End Date Taking? Authorizing Provider  albuterol (PROVENTIL HFA;VENTOLIN HFA) 108 (90 BASE) MCG/ACT inhaler Inhale into the lungs every 6 (six) hours as needed for wheezing or shortness of breath.    [provider]  albuterol (PROVENTIL) (2.5 MG/3ML) 0.083% nebulizer solution Take 2.5 mg by nebulization every 6 (six) hours as needed for wheezing or shortness of breath.    [provider]  ALPRAZolam Duanne Moron) 1 MG tablet Take 1 mg by mouth at bedtime as needed for anxiety.    [provider]  budesonide-formoterol (SYMBICORT) 160-4.5 MCG/ACT inhaler Inhale 2 puffs into the lungs 2 (two) times daily.    [provider]  desipramine (NORPRAMIN) 50 MG tablet Take 50 mg by mouth daily.    [provider]  dicyclomine (BENTYL) 10 MG capsule Take 10 mg by mouth 4 (four) times daily -  before meals and at bedtime.    [provider]  gabapentin (NEURONTIN) 100 MG capsule Take 100 mg by mouth 3 (three) times daily.    [provider]  metoCLOPramide (REGLAN) 10 MG tablet Take 10 mg by mouth 4 (four) times daily.    [provider]  ondansetron (ZOFRAN ODT)  8 MG disintegrating tablet '8mg'$  ODT q8 hours prn nausea 12/20/12   Palumbo, April, MD  ondansetron (ZOFRAN) 4 MG tablet Take 4 mg by mouth every 8 (eight) hours as needed for nausea or vomiting.    [provider]  ondansetron (ZOFRAN) 4 MG tablet Take 1 tablet (4 mg total) by mouth every 8 (eight) hours as needed for nausea or vomiting. 01/01/13   Truddie Hidden, MD  oxyCODONE-acetaminophen (PERCOCET/ROXICET) 5-325 MG per tablet Take 1 tablet by mouth every 8 (eight) hours as needed for severe pain. 10/06/14   Gregor Hams, MD  oxyCODONE-acetaminophen (ROXICET) [phone] MG/5ML solution Take by mouth every 4 (four) hours as needed for severe pain.    [provider]  pantoprazole (PROTONIX) 40 MG tablet Take 40 mg by mouth daily.    [provider]  phenylephrine-chlorpheniramine-dihydrocodeine (PANCOF-PD) 7.5-2-3 MG/5ML syrup Take 5 mLs by mouth every 6 (six) hours as needed for cough.    [provider]  promethazine (PHENERGAN) 25 MG tablet Take 25 mg by mouth every 6 (six) hours as needed for nausea or vomiting.    [provider]  topiramate (TOPAMAX) 25 MG tablet Take 25 mg by mouth 2 (two) times daily.    [provider]      Allergies    Ciprofloxacin, Clindamycin, Sumatriptan, Tamsulosin hcl, Compazine [prochlorperazine], Penicillins, Rocephin [ceftriaxone sodium  in dextrose], Sulfa antibiotics, Toradol [ketorolac tromethamine], and Rizatriptan benzoate    Review of Systems   Review of Systems  Physical Exam Updated Vital Signs BP (!) 135/91 (BP Location: Left Arm)   Pulse 91   Temp 97.9 F (36.6 C) (Oral)   Resp 18   Ht 1.702 m ('5\' 7"'$ )   Wt 77.1 kg   LMP 02/13/2022 (Approximate)   SpO2 100%   BMI 26.63 kg/m  Physical Exam Vitals and nursing note reviewed.  Constitutional:      General: She is not in acute distress.    Appearance: She is well-developed.  HENT:     Head: Normocephalic and atraumatic.     Right Ear:  External ear normal.     Left Ear: External ear normal.  Eyes:     General: No scleral icterus.       Right eye: No discharge.        Left eye: No discharge.     Conjunctiva/sclera: Conjunctivae normal.  Neck:     Trachea: No tracheal deviation.  Cardiovascular:     Rate and Rhythm: Normal rate.  Pulmonary:     Effort: Pulmonary effort is normal. No respiratory distress.     Breath sounds: No stridor.  Abdominal:     General: There is no distension.  Musculoskeletal:        General: No swelling or deformity.     Cervical back: Neck supple.     Comments: Numbness palpation right thigh right knee right lower leg, no effusion, no laceration  Skin:    General: Skin is warm and dry.     Findings: No rash.  Neurological:     Mental Status: She is alert. Mental status is at baseline.     Cranial Nerves: No dysarthria or facial asymmetry.     Motor: No seizure activity.     ED Results / Procedures / Treatments   Labs (all labs ordered are listed, but only abnormal results are displayed) Labs Reviewed  PREGNANCY, URINE    EKG None  Radiology DG Tibia/Fibula Right  Result Date: 02/27/2022 CLINICAL DATA:  Fall EXAM: RIGHT KNEE - COMPLETE 4+ VIEW; RIGHT TIBIA AND FIBULA - 2 VIEW COMPARISON:  None Available. FINDINGS: Right knee: No evidence of fracture, dislocation, or joint effusion. No evidence of arthropathy or other focal bone abnormality. Soft tissues are unremarkable. Right tibia and fibula: No evidence of fracture, dislocation, or joint effusion. No evidence of arthropathy or other focal bone abnormality. Soft tissues are unremarkable. IMPRESSION: No acute displaced fracture or dislocation of the right knee or right tibia and fibula. Electronically Signed   By: Ronney Asters M.D.   On: 02/27/2022 21:22   DG Knee Complete 4 Views Right  Result Date: 02/27/2022 CLINICAL DATA:  Fall EXAM: RIGHT KNEE - COMPLETE 4+ VIEW; RIGHT TIBIA AND FIBULA - 2 VIEW COMPARISON:  None  Available. FINDINGS: Right knee: No evidence of fracture, dislocation, or joint effusion. No evidence of arthropathy or other focal bone abnormality. Soft tissues are unremarkable. Right tibia and fibula: No evidence of fracture, dislocation, or joint effusion. No evidence of arthropathy or other focal bone abnormality. Soft tissues are unremarkable. IMPRESSION: No acute displaced fracture or dislocation of the right knee or right tibia and fibula. Electronically Signed   By: Ronney Asters M.D.   On: 02/27/2022 21:22   DG Hip Unilat  With Pelvis 2-3 Views Right  Result Date: 02/27/2022 CLINICAL DATA:  Fall EXAM: DG HIP (WITH  OR WITHOUT PELVIS) 2-3V RIGHT COMPARISON:  None Available. FINDINGS: There is a right hip arthroplasty in anatomic alignment. There is no evidence of hip fracture or dislocation. There is no evidence of arthropathy or other focal bone abnormality. IMPRESSION: Negative. Electronically Signed   By: Ronney Asters M.D.   On: 02/27/2022 21:20    Procedures Procedures    Medications Ordered in ED Medications  HYDROcodone-acetaminophen (NORCO/VICODIN) 5-325 MG per tablet 1 tablet (has no administration in time range)    ED Course/ Medical Decision Making/ A&P Clinical Course as of 02/27/22 2145  Mon Feb 27, 2022  2142 PDMP reviewed.  30 days oxycodone rx on 1/13 [JK]  2142 X-rays reviewed.  No signs of fracture or dislocation [JK]    Clinical Course User Index [JK] Dorie Rank, MD                             Medical Decision Making Problems Addressed: Muscle strain: acute illness or injury Sprain of right knee, unspecified ligament, initial encounter: acute illness or injury  Amount and/or Complexity of Data Reviewed Labs: ordered. Radiology: ordered and independent interpretation performed.  Risk Prescription drug management.   X-rays do not show any signs of fracture or dislocation.  Consistent with soft tissue injury and sprain.  Will place in a knee immobilizer  and crutches for comfort.  Outpatient follow-up with orthopedics.        Final Clinical Impression(s) / ED Diagnoses Final diagnoses:  Sprain of right knee, unspecified ligament, initial encounter  Muscle strain    Rx / DC Orders ED Discharge Orders     None         Dorie Rank, MD 02/27/22 2145

## 2022-02-27 NOTE — ED Triage Notes (Signed)
States was at work and she slipped on a piece of carpet that wasn't secured, she slipped and fell onto right hip. Recently had hip replacement. Fell with lower leg bent under, c/o right lateral knee pain/upper shin & hip pain.

## 2022-05-11 ENCOUNTER — Emergency Department (HOSPITAL_BASED_OUTPATIENT_CLINIC_OR_DEPARTMENT_OTHER)
Admission: EM | Admit: 2022-05-11 | Discharge: 2022-05-11 | Disposition: A | Discharge status: Home / Self Care | Payer: 59 | Attending: Emergency Medicine | Admitting: Emergency Medicine

## 2022-05-11 ENCOUNTER — Emergency Department (HOSPITAL_BASED_OUTPATIENT_CLINIC_OR_DEPARTMENT_OTHER): Payer: 59

## 2022-05-11 ENCOUNTER — Emergency Department (HOSPITAL_COMMUNITY): Payer: 59

## 2022-05-11 ENCOUNTER — Other Ambulatory Visit: Payer: Self-pay

## 2022-05-11 ENCOUNTER — Encounter (HOSPITAL_BASED_OUTPATIENT_CLINIC_OR_DEPARTMENT_OTHER): Payer: Self-pay | Admitting: *Deleted

## 2022-05-11 DIAGNOSIS — D72829 Elevated white blood cell count, unspecified: Secondary | ICD-10-CM | POA: Diagnosis not present

## 2022-05-11 DIAGNOSIS — R519 Headache, unspecified: Secondary | ICD-10-CM | POA: Diagnosis present

## 2022-05-11 DIAGNOSIS — G43909 Migraine, unspecified, not intractable, without status migrainosus: Secondary | ICD-10-CM | POA: Diagnosis not present

## 2022-05-11 DIAGNOSIS — E871 Hypo-osmolality and hyponatremia: Secondary | ICD-10-CM | POA: Insufficient documentation

## 2022-05-11 DIAGNOSIS — G43809 Other migraine, not intractable, without status migrainosus: Secondary | ICD-10-CM

## 2022-05-11 LAB — COMPREHENSIVE METABOLIC PANEL
ALT: 30 U/L (ref 0–44)
AST: 18 U/L (ref 15–41)
Albumin: 5 g/dL (ref 3.5–5.0)
Alkaline Phosphatase: 69 U/L (ref 38–126)
Anion gap: 12 (ref 5–15)
BUN: 17 mg/dL (ref 6–20)
CO2: 24 mmol/L (ref 22–32)
Calcium: 9.3 mg/dL (ref 8.9–10.3)
Chloride: 98 mmol/L (ref 98–111)
Creatinine, Ser: 0.69 mg/dL (ref 0.44–1.00)
GFR, Estimated: >60 mL/min (ref >60)
Glucose, Bld: 102 mg/dL — ABNORMAL HIGH (ref 70–99)
Potassium: 3.9 mmol/L (ref 3.5–5.1)
Sodium: 134 mmol/L — ABNORMAL LOW (ref 135–145)
Total Bilirubin: 0.6 mg/dL (ref 0.3–1.2)
Total Protein: 8.5 g/dL — ABNORMAL HIGH (ref 6.5–8.1)

## 2022-05-11 LAB — CBC WITH DIFFERENTIAL/PLATELET
Abs Immature Granulocytes: 0.08 10*3/uL — ABNORMAL HIGH (ref 0.00–0.07)
Basophils Absolute: 0.1 10*3/uL (ref 0.0–0.1)
Basophils Relative: 1 %
Eosinophils Absolute: 0.2 10*3/uL (ref 0.0–0.5)
Eosinophils Relative: 1 %
HCT: 43 % (ref 36.0–46.0)
Hemoglobin: 14.6 g/dL (ref 12.0–15.0)
Immature Granulocytes: 1 %
Lymphocytes Relative: 24 %
Lymphs Abs: 2.7 10*3/uL (ref 0.7–4.0)
MCH: 28.5 pg (ref 26.0–34.0)
MCHC: 34 g/dL (ref 30.0–36.0)
MCV: 84 fL (ref 80.0–100.0)
Monocytes Absolute: 0.4 10*3/uL (ref 0.1–1.0)
Monocytes Relative: 4 %
Neutro Abs: 7.7 10*3/uL (ref 1.7–7.7)
Neutrophils Relative %: 69 %
Platelets: 381 10*3/uL (ref 150–400)
RBC: 5.12 MIL/uL — ABNORMAL HIGH (ref 3.87–5.11)
RDW: 12.3 % (ref 11.5–15.5)
WBC: 11.1 10*3/uL — ABNORMAL HIGH (ref 4.0–10.5)
nRBC: 0 % (ref 0.0–0.2)

## 2022-05-11 LAB — PREGNANCY, URINE: Preg Test, Ur: NEGATIVE

## 2022-05-11 MED ORDER — METOCLOPRAMIDE HCL 5 MG/ML IJ SOLN
10.0000 mg | Freq: Once | INTRAMUSCULAR | Status: AC
  Administered 2022-05-11: 10 mg via INTRAVENOUS
  Filled 2022-05-11: qty 2

## 2022-05-11 MED ORDER — ACETAMINOPHEN 500 MG PO TABS
1000.0000 mg | ORAL_TABLET | Freq: Once | ORAL | Status: AC
  Administered 2022-05-11: 1000 mg via ORAL
  Filled 2022-05-11: qty 2

## 2022-05-11 MED ORDER — KETOROLAC TROMETHAMINE 30 MG/ML IJ SOLN
15.0000 mg | Freq: Once | INTRAMUSCULAR | Status: AC
  Administered 2022-05-11: 15 mg via INTRAVENOUS
  Filled 2022-05-11: qty 1

## 2022-05-11 MED ORDER — SODIUM CHLORIDE 0.9 % IV BOLUS
1000.0000 mL | Freq: Once | INTRAVENOUS | Status: AC
  Administered 2022-05-11: 1000 mL via INTRAVENOUS

## 2022-05-11 MED ORDER — DROPERIDOL 2.5 MG/ML IJ SOLN
2.5000 mg | Freq: Once | INTRAMUSCULAR | Status: AC
  Administered 2022-05-11: 2.5 mg via INTRAVENOUS
  Filled 2022-05-11: qty 2

## 2022-05-11 MED ORDER — TETRACAINE HCL 0.5 % OP SOLN
2.0000 [drp] | Freq: Once | OPHTHALMIC | Status: AC
  Administered 2022-05-11: 2 [drp] via OPHTHALMIC
  Filled 2022-05-11: qty 4

## 2022-05-11 MED ORDER — GADOBUTROL 1 MMOL/ML IV SOLN
7.0000 mL | Freq: Once | INTRAVENOUS | Status: AC | PRN
  Administered 2022-05-11: 7 mL via INTRAVENOUS

## 2022-05-11 MED ORDER — DIPHENHYDRAMINE HCL 50 MG/ML IJ SOLN
25.0000 mg | Freq: Once | INTRAMUSCULAR | Status: AC
  Administered 2022-05-11: 25 mg via INTRAVENOUS
  Filled 2022-05-11: qty 1

## 2022-05-11 NOTE — ED Provider Notes (Signed)
[facility] Provider Note   CSN: [id number] Arrival date & time: 05/11/22  1240     History  Chief Complaint  Patient presents with   Migraine    Amy Estes is a 37 y.o. female.   Migraine   37 year old female presents emergency department with complaints of headache.  Patient states headache has been present since this past Monday.  Patient reports history of migraine type headaches but typically involve the left side of her head.  Reports right-sided temporal headache.  Has noted some blurry vision intermittently present since onset in her right eye.  Denies history of visual disturbance accompanied with headaches in the past.  Mother is with patient who states that there is 2-3 generation history of complex migraines without additional diagnoses.  Patient states she has been taking her at home Tylenol, Advil, oxycodone, muscle relaxer with minimal relief of symptoms.  Presents emergency department for further assessment.  Denies fever, chills, night sweats, weakness/sensory deficits in upper or lower extremities, slurred speech, facial droop, gait abnormalities, blood thinner use.  Denies chest pain, shortness of breath, cough, congestion, nausea vomiting, urinary symptoms, change in bowel habits, abdominal pain.  Past medical history significant for PTSD, migraine, gastroparesis, chronic back pain  Home Medications Prior to Admission medications   Medication Sig Start Date End Date Taking? Authorizing Provider  albuterol (PROVENTIL HFA;VENTOLIN HFA) 108 (90 BASE) MCG/ACT inhaler Inhale into the lungs every 6 (six) hours as needed for wheezing or shortness of breath.    [provider]  albuterol (PROVENTIL) (2.5 MG/3ML) 0.083% nebulizer solution Take 2.5 mg by nebulization every 6 (six) hours as needed for wheezing or shortness of breath.    [provider]  ALPRAZolam Duanne Moron) 1 MG tablet Take 1 mg by mouth at bedtime as  needed for anxiety.    [provider]  budesonide-formoterol (SYMBICORT) 160-4.5 MCG/ACT inhaler Inhale 2 puffs into the lungs 2 (two) times daily.    [provider]  desipramine (NORPRAMIN) 50 MG tablet Take 50 mg by mouth daily.    [provider]  dicyclomine (BENTYL) 10 MG capsule Take 10 mg by mouth 4 (four) times daily -  before meals and at bedtime.    [provider]  gabapentin (NEURONTIN) 100 MG capsule Take 100 mg by mouth 3 (three) times daily.    [provider]  metoCLOPramide (REGLAN) 10 MG tablet Take 10 mg by mouth 4 (four) times daily.    [provider]  ondansetron (ZOFRAN ODT) 8 MG disintegrating tablet 8mg  ODT q8 hours prn nausea 12/20/12   Palumbo, April, MD  ondansetron (ZOFRAN) 4 MG tablet Take 4 mg by mouth every 8 (eight) hours as needed for nausea or vomiting.    [provider]  ondansetron (ZOFRAN) 4 MG tablet Take 1 tablet (4 mg total) by mouth every 8 (eight) hours as needed for nausea or vomiting. 01/01/13   Truddie Hidden, MD  oxyCODONE-acetaminophen (PERCOCET/ROXICET) 5-325 MG per tablet Take 1 tablet by mouth every 8 (eight) hours as needed for severe pain. 10/06/14   Gregor Hams, MD  oxyCODONE-acetaminophen (ROXICET) [phone] MG/5ML solution Take by mouth every 4 (four) hours as needed for severe pain.    [provider]  pantoprazole (PROTONIX) 40 MG tablet Take 40 mg by mouth daily.    [provider]  phenylephrine-chlorpheniramine-dihydrocodeine (PANCOF-PD) 7.5-2-3 MG/5ML syrup Take 5 mLs by mouth every 6 (six) hours as needed for cough.  [provider]  promethazine (PHENERGAN) 25 MG tablet Take 25 mg by mouth every 6 (six) hours as needed for nausea or vomiting.    [provider]  topiramate (TOPAMAX) 25 MG tablet Take 25 mg by mouth 2 (two) times daily.    [provider]      Allergies    Ciprofloxacin, Clindamycin, Sumatriptan,  Tamsulosin hcl, Compazine [prochlorperazine], Penicillins, Rocephin [ceftriaxone sodium in dextrose], Sulfa antibiotics, Toradol [ketorolac tromethamine], and Rizatriptan benzoate    Review of Systems   Review of Systems  All other systems reviewed and are negative.   Physical Exam Updated Vital Signs BP (!) 129/93   Pulse 73   Temp 98 F (36.7 C) (Oral)   Resp 16   LMP 05/01/2022   SpO2 100%  Physical Exam Vitals and nursing note reviewed.  Constitutional:      General: She is not in acute distress.    Appearance: She is well-developed.  HENT:     Head: Normocephalic and atraumatic.     Comments: No tenderness palpation of temporal arteries bilaterally. Eyes:     Intraocular pressure: Right eye pressure is 18 mmHg. Left eye pressure is 19 mmHg. Measurements were taken using a handheld tonometer.    Extraocular Movements:     Right eye: Normal extraocular motion and no nystagmus.     Left eye: Normal extraocular motion and no nystagmus.     Conjunctiva/sclera: Conjunctivae normal.     Right eye: Right conjunctiva is not injected. No chemosis, exudate or hemorrhage.    Left eye: Left conjunctiva is not injected. No chemosis, exudate or hemorrhage.    Comments: No pain with EOMs.  Neck:     Comments: Kernig and Brudzinski negative. Cardiovascular:     Rate and Rhythm: Normal rate and regular rhythm.     Heart sounds: No murmur heard. Pulmonary:     Effort: Pulmonary effort is normal. No respiratory distress.     Breath sounds: Normal breath sounds.  Abdominal:     Palpations: Abdomen is soft.     Tenderness: There is no abdominal tenderness.  Musculoskeletal:        General: No swelling.     Cervical back: Neck supple. No rigidity.     Right lower leg: No edema.     Left lower leg: No edema.  Skin:    General: Skin is warm and dry.     Capillary Refill: Capillary refill takes less than 2 seconds.  Neurological:     Mental Status: She is alert.     Comments: Alert  and oriented to self, place, time and event.   Speech is fluent, clear without dysarthria or dysphasia.   Strength 5/5 in upper/lower extremities   Sensation intact in upper/lower extremities   Normal gait.  Negative Romberg. No pronator drift.  Normal finger-to-nose and feet tapping.  CN I not tested  CN II not tesetd CN III, IV, VI PERRLA and EOMs intact bilaterally  CN V Intact sensation to sharp and light touch to the face  CN VII facial movements symmetric  CN VIII not tested  CN IX, X no uvula deviation, symmetric rise of soft palate  CN XI 5/5 SCM and trapezius strength bilaterally  CN XII Midline tongue protrusion, symmetric L/R movements   Patient with subjective blurry vision in the right eye throughout visual fields.  Psychiatric:        Mood and Affect: Mood normal.     ED Results /  Procedures / Treatments   Labs (all labs ordered are listed, but only abnormal results are displayed) Labs Reviewed  COMPREHENSIVE METABOLIC PANEL - Abnormal; Notable for the following components:      Result Value   Sodium 134 (*)    Glucose, Bld 102 (*)    Total Protein 8.5 (*)    All other components within normal limits  CBC WITH DIFFERENTIAL/PLATELET - Abnormal; Notable for the following components:   WBC 11.1 (*)    RBC 5.12 (*)    Abs Immature Granulocytes 0.08 (*)    All other components within normal limits  PREGNANCY, URINE  C-REACTIVE PROTEIN    EKG None  Radiology CT Head Wo Contrast  Result Date: 05/11/2022 CLINICAL DATA:  Headaches since Monday with blurry vision in the right eye. With migraines. EXAM: CT HEAD WITHOUT CONTRAST TECHNIQUE: Contiguous axial images were obtained from the base of the skull through the vertex without intravenous contrast. RADIATION DOSE REDUCTION: This exam was performed according to the departmental dose-optimization program which includes automated exposure control, adjustment of the mA and/or kV according to patient size and/or use  of iterative reconstruction technique. COMPARISON:  None Available. FINDINGS: Brain: There is no acute intracranial hemorrhage, extra-axial fluid collection, or acute infarct. Parenchymal volume is normal. The ventricles are normal in size. Gray-white differentiation is preserved. The pituitary and suprasellar region are normal. There is no mass lesion. There is no mass effect or midline shift. Vascular: No hyperdense vessel or unexpected calcification. Skull: Normal. Negative for fracture or focal lesion. Sinuses/Orbits: The imaged paranasal sinuses are clear. The imaged globes and orbits are unremarkable. Other: None. IMPRESSION: Normal head CT. Electronically Signed   By: Valetta Mole M.D.   On: 05/11/2022 15:09    Procedures Procedures    Medications Ordered in ED Medications  sodium chloride 0.9 % bolus 1,000 mL (0 mLs Intravenous Stopped 05/11/22 1643)  metoCLOPramide (REGLAN) injection 10 mg (10 mg Intravenous Given 05/11/22 1533)  diphenhydrAMINE (BENADRYL) injection 25 mg (25 mg Intravenous Given 05/11/22 1531)  ketorolac (TORADOL) 30 MG/ML injection 15 mg (15 mg Intravenous Given 05/11/22 1528)  tetracaine (PONTOCAINE) 0.5 % ophthalmic solution 2 drop (2 drops Right Eye Given by Other 05/11/22 1725)    ED Course/ Medical Decision Making/ A&P Clinical Course as of 05/11/22 1803  Thu May 11, 2022  1607 Reassessment of the patient showed the patient still with headache.  Patient just received migraine medication.  To reassess for response to treatment. [CR]  1628 Reassessment shows some improvement of patient's headache after migraine cocktail.  Patient still with feelings as of film is over right-sided visual fields diffusely. [CR]  [place] Consulted hospitalist Dr. Mickel Baas who recommended transfer to [facility] emergency department for MRI brain with and without. [CR]    Clinical Course User Index [CR] Wilnette Kales, PA                             Medical Decision Making Amount and/or Complexity  of Data Reviewed Labs: ordered. Radiology: ordered.  Risk Prescription drug management.   This patient presents to the ED for concern of headache, this involves an extensive number of treatment options, and is a complaint that carries with it a high risk of complications and morbidity.  The differential diagnosis includes migraine/tension/cluster headache, CVA, cerebral venous thrombosis, pseudotumor cerebri, malignancy, vertebral/carotid artery dissection, optic neuritis, MS   Co morbidities that complicate the patient evaluation  See  HPI   Additional history obtained:  Additional history obtained from EMR External records from outside source obtained and reviewed including hospital records   Lab Tests:  I Ordered, and personally interpreted labs.  The pertinent results include: Mild leukocytosis of 11.1.  No evidence anemia.  Platelets within range.  Mild hyponatremia of 134 supplemented via IV fluids but otherwise, electrolytes within normal limits.  No transaminitis.  No renal dysfunction.  Urine pregnancy negative.   Imaging Studies ordered:  I ordered imaging studies including CT head I independently visualized and interpreted imaging which showed no acute intracranial abnormality I agree with the radiologist interpretation   Cardiac Monitoring: / EKG:  The patient was maintained on a cardiac monitor.  I personally viewed and interpreted the cardiac monitored which showed an underlying rhythm of: Sinus rhythm   Consultations Obtained:  See ED course  Problem List / ED Course / Critical interventions / Medication management  Headache/right eye blurry vision I ordered medication including Toradol, Benadryl, Reglan, 1 L normal saline   Reevaluation of the patient after these medicines showed that the patient minimal improvement I have reviewed the patients home medicines and have made adjustments as needed   Social Determinants of Health:  Chronic cigarette  use.  Denies illicit drug use.   Test / Admission - Considered:  Headache/right eye blurry vision Vitals signs within normal range and stable throughout visit. Laboratory/imaging studies significant for: See above 37 year old female presents emergency department with right-sided headache with blurry vision.  Given the patient has history of migraines and this 1 feels different, 1-2 workup was performed of which CT imaging of the head was negative and patient noted only minimal response to migraine cocktail.  Patient without temporal artery tenderness, pain with EOMs, proptotic eye.  Patient 20/20 OD OS OU corrected with lenses in exam room.  Appreciable given patient's description of differing headache with visual symptoms, neurology was consulted for further direction.  Consulted Dr. Jerrell Belfast who recommended transfer to [facility] emergency department for further assessment via MRI brain with and without contrast.  Further disposition of patient to be determined based on imaging study.  Recommended if negative, follow-up outpatient for complex migraine.  If abnormality of MRI, reconsultation to be made with neurology.  Treatment plan discussed at length with patient and she acknowledged understanding was agreeable to said plan.  She elected to travel via POV to [facility] emergency department.        Final Clinical Impression(s) / ED Diagnoses Final diagnoses:  None    Rx / DC Orders ED Discharge Orders     None         Peter Garter, [place] 05/11/22 1803    Blane Ohara, MD 05/13/22 2300

## 2022-05-11 NOTE — ED Notes (Signed)
In with PA C and Pt.   Pt. Explained to [place] that she is not allergic to the Toradol   Verbal order to order the toradol

## 2022-05-11 NOTE — ED Notes (Signed)
Pt. Reports history of migraines and is having a headache at this time.  Pt. Reports she is having blurred vision in the R eye.  Headache since Monday per pt.

## 2022-05-11 NOTE — ED Notes (Signed)
Have attempted IV on Pt. And Charge RN Shirlean Mylar also attempted IV on the Pt.   Will have EDP or Resp. Attempt [place] IV on the Pt. Due to hard IV stick and no IV access.

## 2022-05-11 NOTE — ED Notes (Signed)
Pt. Walked to rest room after fluids finished.

## 2022-05-11 NOTE — ED Provider Notes (Signed)
  Physical Exam  BP 131/88 (BP Location: Right Arm)   Pulse 72   Temp 98.1 F (36.7 C) (Oral)   Resp 18   LMP 05/01/2022   SpO2 99%   Physical Exam Vitals and nursing note reviewed.  Constitutional:      Appearance: Normal appearance.  HENT:     Head: Normocephalic and atraumatic.     Mouth/Throat:     Mouth: Mucous membranes are moist.  Eyes:     Conjunctiva/sclera: Conjunctivae normal.  Cardiovascular:     Rate and Rhythm: Normal rate.  Pulmonary:     Effort: Pulmonary effort is normal. No respiratory distress.  Abdominal:     General: Abdomen is flat.  Musculoskeletal:        General: No deformity.  Skin:    General: Skin is warm and dry.     Capillary Refill: Capillary refill takes less than 2 seconds.  Neurological:     General: No focal deficit present.     Mental Status: She is alert and oriented to person, place, and time. Mental status is at baseline.     Cranial Nerves: No cranial nerve deficit.     Sensory: No sensory deficit.     Motor: No weakness.     Coordination: Coordination normal.     Gait: Gait normal.  Psychiatric:        Mood and Affect: Mood normal.        Behavior: Behavior normal.     Procedures  Procedures  ED Course / MDM   Clinical Course as of 05/11/22 2352  Thu May 11, 2022  1607 Reassessment of the patient showed the patient still with headache.  Patient just received migraine medication.  To reassess for response to treatment. [CR]  1628 Reassessment shows some improvement of patient's headache after migraine cocktail.  Patient still with feelings as of film is over right-sided visual fields diffusely. [CR]  [place] Consulted hospitalist Dr. Mickel Baas who recommended transfer to [facility] emergency department for MRI brain with and without. [CR]  2352 Patient transferred from [facility] for MRI in the setting of likely complex migraine.  MRI negative for acute process.  Patient still having some migraine headaches so received  droperidol.  She reports that this helped with her headache and she request discharge. Will discharge patient to home. All questions answered. Patient comfortable with plan of discharge. Return precautions discussed with patient and specified on the after visit summary.  [WS]    Clinical Course User Index [CR] Wilnette Kales, PA [WS] Cristie Hem, MD   Medical Decision Making Amount and/or Complexity of Data Reviewed Labs: ordered. Radiology: ordered.  Risk OTC drugs. Prescription drug management.          Cristie Hem, MD 05/11/22 2352

## 2022-05-11 NOTE — ED Notes (Addendum)
Pt. Still reports she is having visual disturbance in the R eye.  No trouble with walking to restroom.  Pt. Walks a steady gait and has clear concise speech.

## 2022-05-11 NOTE — Discharge Instructions (Addendum)
We evaluated you for your headaches.  Your testing including MRI was reassuring.  Please call your neurologist for a follow-up appointment.  You may need to restart your migraine medication to help prevent further migraines.  Please be sure to get lots of sleep.  Please take Tylenol and Motrin for your symptoms at home.  You can take 1000 mg of Tylenol every 6 hours and 600 mg of ibuprofen every 6 hours as needed for your symptoms.  You can take these medicines together as needed, either at the same time, or alternating every 3 hours.  If you develop any new symptoms such as fevers, uncontrolled symptoms, weakness or inability to walk, or any other symptom concerning symptoms, please return to the emergency department.

## 2022-05-11 NOTE — ED Triage Notes (Signed)
Migraine since Tuesday.  Pt took tylenol, advil, muscle relaxers and oxycodone at home without relief.  Vision in right eye is affected since yesterday (blurred and goes out intermittent)

## 2022-05-11 NOTE — ED Notes (Signed)
IV attempt right A/C x1 with ultrasound.  Unable to thread vein.  Ultra sound at bedside for EDP.

## 2022-05-11 NOTE — ED Provider Notes (Signed)
I provided a substantive portion of the care of this patient.  I personally made/approved the management plan for this patient and take responsibility for the patient management.     Ultrasound ED Peripheral IV (Provider)  Date/Time: 05/11/2022 3:23 PM  Performed by: Blane OharaZavitz, Gaile Allmon, MD Authorized by: Blane OharaZavitz, Cobain Morici, MD   Procedure details:    Indications: multiple failed IV attempts     Skin Prep: chlorhexidine gluconate     Location:  Left AC   Angiocath:  20 G   Bedside Ultrasound Guided: Yes     Images: archived     Patient tolerated procedure without complications: Yes       Blane OharaZavitz, Camrie Stock, MD 05/13/22 [phone]

## 2022-05-12 LAB — C-REACTIVE PROTEIN: CRP: 0.6 mg/dL (ref <1.0)

## 2023-08-29 ENCOUNTER — Other Ambulatory Visit: Payer: Self-pay

## 2023-08-29 ENCOUNTER — Encounter (HOSPITAL_BASED_OUTPATIENT_CLINIC_OR_DEPARTMENT_OTHER): Payer: Self-pay | Admitting: Orthopedic Surgery

## 2023-08-29 ENCOUNTER — Other Ambulatory Visit: Payer: Self-pay | Admitting: Orthopedic Surgery

## 2023-08-30 ENCOUNTER — Ambulatory Visit (HOSPITAL_BASED_OUTPATIENT_CLINIC_OR_DEPARTMENT_OTHER): Payer: Self-pay | Admitting: Anesthesiology

## 2023-08-30 ENCOUNTER — Other Ambulatory Visit: Payer: Self-pay

## 2023-08-30 ENCOUNTER — Ambulatory Visit (HOSPITAL_BASED_OUTPATIENT_CLINIC_OR_DEPARTMENT_OTHER): Payer: Self-pay

## 2023-08-30 ENCOUNTER — Encounter (HOSPITAL_BASED_OUTPATIENT_CLINIC_OR_DEPARTMENT_OTHER)
Admission: RE | Disposition: A | Discharge status: Home / Self Care | Payer: Self-pay | Source: Home / Self Care | Attending: Orthopedic Surgery

## 2023-08-30 ENCOUNTER — Ambulatory Visit (HOSPITAL_BASED_OUTPATIENT_CLINIC_OR_DEPARTMENT_OTHER)
Admission: RE | Admit: 2023-08-30 | Discharge: 2023-08-30 | Disposition: A | Discharge status: Home / Self Care | Payer: Self-pay | Attending: Orthopedic Surgery | Admitting: Orthopedic Surgery

## 2023-08-30 ENCOUNTER — Encounter (HOSPITAL_BASED_OUTPATIENT_CLINIC_OR_DEPARTMENT_OTHER): Payer: Self-pay | Admitting: Orthopedic Surgery

## 2023-08-30 DIAGNOSIS — W228XXA Striking against or struck by other objects, initial encounter: Secondary | ICD-10-CM | POA: Insufficient documentation

## 2023-08-30 DIAGNOSIS — Z87891 Personal history of nicotine dependence: Secondary | ICD-10-CM | POA: Insufficient documentation

## 2023-08-30 DIAGNOSIS — Z01818 Encounter for other preprocedural examination: Secondary | ICD-10-CM

## 2023-08-30 DIAGNOSIS — S62607A Fracture of unspecified phalanx of left little finger, initial encounter for closed fracture: Secondary | ICD-10-CM

## 2023-08-30 DIAGNOSIS — S62617A Displaced fracture of proximal phalanx of left little finger, initial encounter for closed fracture: Secondary | ICD-10-CM | POA: Insufficient documentation

## 2023-08-30 HISTORY — PX: CLOSED REDUCTION METACARPAL WITH PERCUTANEOUS PINNING: SHX5613

## 2023-08-30 LAB — POCT PREGNANCY, URINE: Preg Test, Ur: NEGATIVE

## 2023-08-30 SURGERY — CLOSED REDUCTION, FRACTURE, METACARPAL BONE, WITH PERCUTANEOUS PINNING
Anesthesia: General | Site: Little Finger | Laterality: Left

## 2023-08-30 MED ORDER — OXYCODONE HCL 5 MG PO TABS
5.0000 mg | ORAL_TABLET | Freq: Once | ORAL | Status: AC
  Administered 2023-08-30: 5 mg via ORAL

## 2023-08-30 MED ORDER — ONDANSETRON HCL 4 MG/2ML IJ SOLN
INTRAMUSCULAR | Status: DC | PRN
  Administered 2023-08-30: 4 mg via INTRAVENOUS

## 2023-08-30 MED ORDER — IPRATROPIUM-ALBUTEROL 0.5-2.5 (3) MG/3ML IN SOLN
3.0000 mL | RESPIRATORY_TRACT | Status: DC
  Administered 2023-08-30: 3 mL via RESPIRATORY_TRACT

## 2023-08-30 MED ORDER — FENTANYL CITRATE (PF) 100 MCG/2ML IJ SOLN
INTRAMUSCULAR | Status: AC
  Filled 2023-08-30: qty 2

## 2023-08-30 MED ORDER — DEXAMETHASONE SODIUM PHOSPHATE 10 MG/ML IJ SOLN
INTRAMUSCULAR | Status: AC
  Filled 2023-08-30: qty 1

## 2023-08-30 MED ORDER — IPRATROPIUM-ALBUTEROL 0.5-2.5 (3) MG/3ML IN SOLN
RESPIRATORY_TRACT | Status: AC
  Filled 2023-08-30: qty 3

## 2023-08-30 MED ORDER — FENTANYL CITRATE (PF) 100 MCG/2ML IJ SOLN
25.0000 ug | INTRAMUSCULAR | Status: DC | PRN
  Administered 2023-08-30 (×2): 50 ug via INTRAVENOUS

## 2023-08-30 MED ORDER — PROPOFOL 10 MG/ML IV BOLUS
INTRAVENOUS | Status: AC
Start: 2023-08-30 — End: 2023-08-30
  Filled 2023-08-30: qty 20

## 2023-08-30 MED ORDER — BUPIVACAINE HCL (PF) 0.25 % IJ SOLN
INTRAMUSCULAR | Status: DC | PRN
  Administered 2023-08-30: 9 mL

## 2023-08-30 MED ORDER — DEXAMETHASONE SODIUM PHOSPHATE 10 MG/ML IJ SOLN
INTRAMUSCULAR | Status: DC | PRN
  Administered 2023-08-30: 10 mg via INTRAVENOUS

## 2023-08-30 MED ORDER — PROPOFOL 10 MG/ML IV BOLUS
INTRAVENOUS | Status: DC | PRN
Start: 2023-08-30 — End: 2023-08-30
  Administered 2023-08-30: 100 mg via INTRAVENOUS
  Administered 2023-08-30: 200 mg via INTRAVENOUS
  Administered 2023-08-30: 100 mg via INTRAVENOUS

## 2023-08-30 MED ORDER — FENTANYL CITRATE (PF) 100 MCG/2ML IJ SOLN
INTRAMUSCULAR | Status: DC | PRN
  Administered 2023-08-30: 100 ug via INTRAVENOUS

## 2023-08-30 MED ORDER — ACETAMINOPHEN 500 MG PO TABS
ORAL_TABLET | ORAL | Status: AC
Start: 2023-08-30 — End: 2023-08-30
  Filled 2023-08-30: qty 2

## 2023-08-30 MED ORDER — ONDANSETRON HCL 4 MG/2ML IJ SOLN
INTRAMUSCULAR | Status: AC
  Filled 2023-08-30: qty 2

## 2023-08-30 MED ORDER — LACTATED RINGERS IV SOLN: INTRAVENOUS | Status: DC

## 2023-08-30 MED ORDER — OXYCODONE HCL 5 MG PO TABS
ORAL_TABLET | ORAL | Status: AC
  Filled 2023-08-30: qty 1

## 2023-08-30 MED ORDER — ACETAMINOPHEN 500 MG PO TABS
1000.0000 mg | ORAL_TABLET | Freq: Once | ORAL | Status: AC
  Administered 2023-08-30: 1000 mg via ORAL

## 2023-08-30 MED ORDER — VANCOMYCIN HCL 1000 MG IV SOLR
INTRAVENOUS | Status: DC | PRN
  Administered 2023-08-30: 1000 mg via INTRAVENOUS

## 2023-08-30 MED ORDER — LIDOCAINE 2% (20 MG/ML) 5 ML SYRINGE
INTRAMUSCULAR | Status: DC | PRN
  Administered 2023-08-30: 60 mg via INTRAVENOUS

## 2023-08-30 MED ORDER — LIDOCAINE 2% (20 MG/ML) 5 ML SYRINGE
INTRAMUSCULAR | Status: AC
  Filled 2023-08-30: qty 5

## 2023-08-30 MED ORDER — MIDAZOLAM HCL 5 MG/5ML IJ SOLN
INTRAMUSCULAR | Status: DC | PRN
  Administered 2023-08-30: 2 mg via INTRAVENOUS

## 2023-08-30 MED ORDER — MIDAZOLAM HCL 2 MG/2ML IJ SOLN
INTRAMUSCULAR | Status: AC
  Filled 2023-08-30: qty 2

## 2023-08-30 MED ORDER — AMISULPRIDE (ANTIEMETIC) 5 MG/2ML IV SOLN: 10.0000 mg | Freq: Once | INTRAVENOUS | Status: DC | PRN

## 2023-08-30 SURGICAL SUPPLY — 47 items
BLADE MINI RND TIP GREEN BEAV (BLADE) IMPLANT
BLADE SURG 15 STRL LF DISP TIS (BLADE) ×2 IMPLANT
BNDG COMPR ESMARK 4X3 LF (GAUZE/BANDAGES/DRESSINGS) ×1 IMPLANT
BNDG ELASTIC 2INX 5YD STR LF (GAUZE/BANDAGES/DRESSINGS) IMPLANT
BNDG ELASTIC 3INX 5YD STR LF (GAUZE/BANDAGES/DRESSINGS) ×1 IMPLANT
BNDG GAUZE DERMACEA FLUFF 4 (GAUZE/BANDAGES/DRESSINGS) ×1 IMPLANT
CHLORAPREP W/TINT 26 (MISCELLANEOUS) ×1 IMPLANT
CORD BIPOLAR FORCEPS 12FT (ELECTRODE) ×1 IMPLANT
COVER BACK TABLE 60X90IN (DRAPES) ×1 IMPLANT
COVER MAYO STAND STRL (DRAPES) ×1 IMPLANT
CUFF TOURN SGL QUICK 18X4 (TOURNIQUET CUFF) ×1 IMPLANT
DRAPE EXTREMITY T 121X128X90 (DISPOSABLE) ×1 IMPLANT
DRAPE OEC MINIVIEW 54X84 (DRAPES) ×1 IMPLANT
DRAPE SURG 17X23 STRL (DRAPES) ×1 IMPLANT
GAUZE SPONGE 4X4 12PLY STRL (GAUZE/BANDAGES/DRESSINGS) ×1 IMPLANT
GAUZE XEROFORM 1X8 LF (GAUZE/BANDAGES/DRESSINGS) ×1 IMPLANT
GLOVE BIO SURGEON STRL SZ7.5 (GLOVE) ×1 IMPLANT
GLOVE BIOGEL PI IND STRL 7.0 (GLOVE) IMPLANT
GLOVE BIOGEL PI IND STRL 8 (GLOVE) ×1 IMPLANT
GLOVE BIOGEL PI IND STRL 8.5 (GLOVE) IMPLANT
GLOVE SURG ORTHO 8.0 STRL STRW (GLOVE) IMPLANT
GOWN STRL REUS W/ TWL LRG LVL3 (GOWN DISPOSABLE) ×1 IMPLANT
GOWN STRL REUS W/ TWL XL LVL3 (GOWN DISPOSABLE) IMPLANT
GOWN STRL REUS W/TWL XL LVL3 (GOWN DISPOSABLE) ×1 IMPLANT
KWIRE DBL .035X4 NSTRL (WIRE) IMPLANT
NDL HYPO 22X1.5 SAFETY MO (MISCELLANEOUS) IMPLANT
NDL HYPO 25X1 1.5 SAFETY (NEEDLE) IMPLANT
NEEDLE HYPO 22X1.5 SAFETY MO (MISCELLANEOUS) IMPLANT
NEEDLE HYPO 25X1 1.5 SAFETY (NEEDLE) ×1 IMPLANT
NS IRRIG 1000ML POUR BTL (IV SOLUTION) ×1 IMPLANT
PACK BASIN DAY SURGERY FS (CUSTOM PROCEDURE TRAY) ×1 IMPLANT
PAD CAST 3X4 CTTN HI CHSV (CAST SUPPLIES) ×1 IMPLANT
PAD CAST 4YDX4 CTTN HI CHSV (CAST SUPPLIES) IMPLANT
PADDING CAST ABS COTTON 4X4 ST (CAST SUPPLIES) ×1 IMPLANT
SLEEVE SCD COMPRESS KNEE MED (STOCKING) IMPLANT
SPLINT PLASTER CAST XFAST 3X15 (CAST SUPPLIES) IMPLANT
SPLINT PLASTER CAST XFAST 4X15 (CAST SUPPLIES) IMPLANT
STOCKINETTE 4X48 STRL (DRAPES) ×1 IMPLANT
SUT ETHILON 3 0 PS 1 (SUTURE) IMPLANT
SUT ETHILON 4 0 PS 2 18 (SUTURE) ×1 IMPLANT
SUT MERSILENE 4 0 P 3 (SUTURE) IMPLANT
SUT VIC AB 3-0 PS1 18XBRD (SUTURE) IMPLANT
SUT VIC AB 4-0 PS2 18 (SUTURE) IMPLANT
SYR BULB EAR ULCER 3OZ GRN STR (SYRINGE) ×1 IMPLANT
SYR CONTROL 10ML LL (SYRINGE) IMPLANT
TOWEL GREEN STERILE FF (TOWEL DISPOSABLE) ×2 IMPLANT
UNDERPAD 30X36 HEAVY ABSORB (UNDERPADS AND DIAPERS) ×1 IMPLANT
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
## 2023-08-30 NOTE — Transfer of Care (Signed)
 Immediate Anesthesia Transfer of Care Note  Patient: Amy Estes  Procedure(s) Performed: LEFT SMALL FINGER CLOSED REDUCTION WITH PERCUTANEOUS PINNING (Left: Little Finger)  Patient Location: PACU  Anesthesia Type:General  Level of Consciousness: drowsy  Airway & Oxygen Therapy: Patient Spontanous Breathing and Patient connected to face mask oxygen  Post-op Assessment: Report given to RN and Post -op Vital signs reviewed and stable  Post vital signs: Reviewed and stable  Last Vitals:  Vitals Value Taken Time  BP    Temp    Pulse 100 08/30/23 15:41  Resp 18 08/30/23 15:41  SpO2 96 % 08/30/23 15:41  Vitals shown include unfiled device data.  Last Pain:  Vitals:   08/30/23 1351  TempSrc: Temporal  PainSc: 8       Patients Stated Pain Goal: 4 (08/30/23 1351)  Complications: No notable events documented.

## 2023-08-30 NOTE — Discharge Instructions (Addendum)
No tylenol until 7:00 p.m.  [facility] Instructions Hand Surgery  Wound Care: Keep your hand elevated above the level of your heart.  Do not allow it to dangle by your side.  Keep the dressing dry and do not remove it unless your doctor advises you to do so.  He will usually change it at the time of your post-op visit.  Moving your fingers is advised to stimulate circulation but will depend on the site of your surgery.  If you have a splint applied, your doctor will advise you regarding movement.  Activity: Do not drive or operate machinery today.  Rest today and then you may return to your normal activity and work as indicated by your physician.  Diet:  Drink liquids today or eat a light diet.  You may resume a regular diet tomorrow.    General expectations: Pain for two to three days. Fingers may become slightly swollen.  Call your doctor if any of the following occur: Severe pain not relieved by pain medication. Elevated temperature. Dressing soaked with blood. Inability to move fingers. White or bluish color to fingers.  Post Anesthesia Home Care Instructions  Activity: Get plenty of rest for the remainder of the day. A responsible individual must stay with you for 24 hours following the procedure.  For the next 24 hours, DO NOT: -Drive a car -Paediatric nurse -Drink alcoholic beverages -Take any medication unless instructed by your physician -Make any legal decisions or sign important papers.  Meals: Start with liquid foods such as gelatin or soup. Progress to regular foods as tolerated. Avoid greasy, spicy, heavy foods. If nausea and/or vomiting occur, drink only clear liquids until the nausea and/or vomiting subsides. Call your physician if vomiting continues.  Special Instructions/Symptoms: Your throat may feel dry or sore from the anesthesia or the breathing tube placed in your throat during surgery. If this causes discomfort, gargle with warm salt water. The  discomfort should disappear within 24 hours.  If you had a scopolamine patch placed behind your ear for the management of post- operative nausea and/or vomiting:  1. The medication in the patch is effective for 72 hours, after which it should be removed.  Wrap patch in a tissue and discard in the trash. Wash hands thoroughly with soap and water. 2. You may remove the patch earlier than 72 hours if you experience unpleasant side effects which may include dry mouth, dizziness or visual disturbances. 3. Avoid touching the patch. Wash your hands with soap and water after contact with the patch.
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
## 2023-08-30 NOTE — Op Note (Signed)
 I assisted Surgeons and Role:    * Murrell Drivers, MD - Primary    DEWAINE Murrell Kuba, MD - Assisting on the Procedure(s): LEFT SMALL FINGER CLOSED REDUCTION WITH PERCUTANEOUS PINNING on 08/30/2023.  I provided assistance on this case as follows: Set up, support for traction on the finger for reduction, support for placement of pins, location of the dressing and splint.  Electronically signed by: Kuba Murrell, MD Date: 08/30/2023 Time: 3:28 PM

## 2023-08-30 NOTE — Anesthesia Procedure Notes (Signed)
 Procedure Name: LMA Insertion Date/Time: 08/30/2023 2:58 PM  Performed by: Maudine Grayce ORN, CRNAPre-anesthesia Checklist: Patient identified, Emergency Drugs available, Suction available and Patient being monitored Patient Re-evaluated:Patient Re-evaluated prior to induction Oxygen Delivery Method: Circle System Utilized Preoxygenation: Pre-oxygenation with 100% oxygen Induction Type: IV induction Ventilation: Mask ventilation without difficulty LMA: LMA inserted LMA Size: 4.0 Number of attempts: 1 Airway Equipment and Method: Bite block Placement Confirmation: positive ETCO2 Tube secured with: Tape Dental Injury: Teeth and Oropharynx as per pre-operative assessment

## 2023-08-30 NOTE — Anesthesia Preprocedure Evaluation (Addendum)
 Anesthesia Evaluation  Patient identified by MRN, date of birth, ID band Patient awake    Reviewed: Allergy & Precautions, NPO status , Patient's Chart, lab work & pertinent test results  Airway Mallampati: II  TM Distance: >3 FB Neck ROM: Full    Dental  (+) Poor Dentition   Pulmonary former smoker   Pulmonary exam normal        Cardiovascular negative cardio ROS Normal cardiovascular exam     Neuro/Psych  Headaches PSYCHIATRIC DISORDERS Anxiety     PTSD (post-traumatic stress disorder)   GI/Hepatic negative GI ROS, Neg liver ROS,,,  Endo/Other  negative endocrine ROS    Renal/GU Renal disease     Musculoskeletal negative musculoskeletal ROS (+)    Abdominal   Peds  Hematology negative hematology ROS (+)   Anesthesia Other Findings LEFT SMALL FINGER FRACTURE  Reproductive/Obstetrics Hcg negative                              Anesthesia Physical Anesthesia Plan  ASA: 2  Anesthesia Plan: General   Post-op Pain Management:    Induction: Intravenous  PONV Risk Score and Plan: 3 and Ondansetron , Midazolam , Dexamethasone  and Treatment may vary due to age or medical condition  Airway Management Planned: LMA  Additional Equipment:   Intra-op Plan:   Post-operative Plan: Extubation in OR  Informed Consent: I have reviewed the patients History and Physical, chart, labs and discussed the procedure including the risks, benefits and alternatives for the proposed anesthesia with the patient or authorized representative who has indicated his/her understanding and acceptance.     Dental advisory given  Plan Discussed with: CRNA  Anesthesia Plan Comments:          Anesthesia Quick Evaluation

## 2023-08-31 ENCOUNTER — Encounter (HOSPITAL_BASED_OUTPATIENT_CLINIC_OR_DEPARTMENT_OTHER): Payer: Self-pay | Admitting: Orthopedic Surgery

## 2023-09-10 NOTE — Anesthesia Postprocedure Evaluation (Signed)
 Anesthesia Post Note  Patient: Amy Estes  Procedure(s) Performed: LEFT SMALL FINGER CLOSED REDUCTION WITH PERCUTANEOUS PINNING (Left: Little Finger)     Patient location during evaluation: PACU Anesthesia Type: General Level of consciousness: awake Pain management: pain level controlled Vital Signs Assessment: post-procedure vital signs reviewed and stable Respiratory status: spontaneous breathing, nonlabored ventilation and respiratory function stable Cardiovascular status: blood pressure returned to baseline and stable Postop Assessment: no apparent nausea or vomiting Anesthetic complications: no   No notable events documented.  Last Vitals:  Vitals:   08/30/23 1615 08/30/23 1634  BP: 116/80 114/76  Pulse: 93 92  Resp: 19 18  Temp: 36.7 C 36.7 C  SpO2: 92% 92%    Last Pain:  Vitals:   08/30/23 1634  TempSrc: Temporal  PainSc:                  Bernardino SQUIBB [name]
